# Patient Record
Sex: Female | Born: 1935 | Race: White | Hispanic: No | Marital: Single
Health system: Midwestern US, Community
[De-identification: ages and names within clinical notes are randomized; demographics above are authoritative.]

## PROBLEM LIST (undated history)

## (undated) DIAGNOSIS — E119 Type 2 diabetes mellitus without complications: Secondary | ICD-10-CM

## (undated) DIAGNOSIS — J45909 Unspecified asthma, uncomplicated: Secondary | ICD-10-CM

## (undated) DIAGNOSIS — I10 Essential (primary) hypertension: Secondary | ICD-10-CM

## (undated) DIAGNOSIS — E7800 Pure hypercholesterolemia, unspecified: Secondary | ICD-10-CM

## (undated) DIAGNOSIS — E039 Hypothyroidism, unspecified: Secondary | ICD-10-CM

## (undated) DIAGNOSIS — J449 Chronic obstructive pulmonary disease, unspecified: Secondary | ICD-10-CM

## (undated) HISTORY — PX: ABDOMINAL HYSTERECTOMY: SHX81

## (undated) HISTORY — PX: CHOLECYSTECTOMY: SHX55

---

## 2014-01-17 ENCOUNTER — Inpatient Hospital Stay
Admit: 2014-01-17 | Discharge: 2014-01-20 | Disposition: A | Discharge status: Home / Self Care | Payer: PRIVATE HEALTH INSURANCE | Attending: Family Medicine | Admitting: Family Medicine

## 2014-01-17 DIAGNOSIS — J9600 Acute respiratory failure, unspecified whether with hypoxia or hypercapnia: Secondary | ICD-10-CM

## 2014-01-17 LAB — CBC WITH AUTOMATED DIFF
ABS. BASOPHILS: 0 10*3/uL (ref 0.0–0.06)
ABS. EOSINOPHILS: 0.4 10*3/uL (ref 0.0–0.4)
ABS. LYMPHOCYTES: 1.3 10*3/uL (ref 0.9–3.6)
ABS. MONOCYTES: 0.5 10*3/uL (ref 0.05–1.2)
ABS. NEUTROPHILS: 8.8 10*3/uL — ABNORMAL HIGH (ref 1.8–8.0)
BASOPHILS: 0 % (ref 0–2)
EOSINOPHILS: 3 % (ref 0–5)
HCT: 46.1 % — ABNORMAL HIGH (ref 35.0–45.0)
HGB: 15.8 g/dL (ref 12.0–16.0)
LYMPHOCYTES: 12 % — ABNORMAL LOW (ref 21–52)
MCH: 31.2 PG (ref 24.0–34.0)
MCHC: 34.3 g/dL (ref 31.0–37.0)
MCV: 90.9 FL (ref 74.0–97.0)
MONOCYTES: 5 % (ref 3–10)
MPV: 10.7 FL (ref 9.2–11.8)
NEUTROPHILS: 80 % — ABNORMAL HIGH (ref 40–73)
PLATELET: 182 10*3/uL (ref 135–420)
RBC: 5.07 M/uL (ref 4.20–5.30)
RDW: 12.7 % (ref 11.6–14.5)
WBC: 11 10*3/uL (ref 4.6–13.2)

## 2014-01-17 LAB — METABOLIC PANEL, COMPREHENSIVE
A-G Ratio: 0.9 (ref 0.8–1.7)
ALT (SGPT): 64 U/L — ABNORMAL HIGH (ref 13–56)
AST (SGOT): 46 U/L — ABNORMAL HIGH (ref 15–37)
Albumin: 3.7 g/dL (ref 3.4–5.0)
Alk. phosphatase: 135 U/L — ABNORMAL HIGH (ref 45–117)
Anion gap: 13 mmol/L (ref 3.0–18)
BUN/Creatinine ratio: 15 (ref 12–20)
BUN: 12 MG/DL (ref 7.0–18)
Bilirubin, total: 0.4 MG/DL (ref 0.2–1.0)
CO2: 30 mmol/L (ref 21–32)
Calcium: 9.5 MG/DL (ref 8.5–10.1)
Chloride: 95 mmol/L — ABNORMAL LOW (ref 100–108)
Creatinine: 0.8 MG/DL (ref 0.6–1.3)
GFR est AA: >60 mL/min/{1.73_m2} (ref >60)
GFR est non-AA: >60 mL/min/{1.73_m2} (ref >60)
Globulin: 4 g/dL (ref 2.0–4.0)
Glucose: 294 mg/dL — ABNORMAL HIGH (ref 74–99)
Potassium: 4 mmol/L (ref 3.5–5.5)
Protein, total: 7.7 g/dL (ref 6.4–8.2)
Sodium: 138 mmol/L (ref 136–145)

## 2014-01-17 LAB — BLOOD GAS, ARTERIAL POC
Base excess (POC): 4 mmol/L
Bleed In: 0 L/min
FIO2 (POC): 0.4 %
HCO3 (POC): 30.4 MMOL/L — ABNORMAL HIGH (ref 22–26)
PEEP/CPAP (POC): 5 cmH2O
PIP (POC): 13
Patient temp.: 98.6
Pressure support: 7 cmH2O
Total resp. rate: 28
pCO2 (POC): 60.2 MMHG — ABNORMAL HIGH (ref 35.0–45.0)
pH (POC): 7.312 — ABNORMAL LOW (ref 7.35–7.45)
pO2 (POC): 184 MMHG — ABNORMAL HIGH (ref 80–100)
sO2 (POC): 99 % — ABNORMAL HIGH (ref 92–97)

## 2014-01-17 LAB — D DIMER: D DIMER: 0.89 ug/ml(FEU) — ABNORMAL HIGH (ref <0.46)

## 2014-01-17 LAB — D-DIMER, QUANTITATIVE: D-Dimer, Quant: 0.89 ug/ml(FEU) — ABNORMAL HIGH (ref <0.46)

## 2014-01-17 MED ORDER — METHYLPREDNISOLONE (PF) 125 MG/2 ML IJ SOLR
125 mg/2 mL | INTRAMUSCULAR | Status: AC
Start: 2014-01-17 — End: 2014-01-17
  Administered 2014-01-17: 23:00:00 via INTRAVENOUS

## 2014-01-17 MED ORDER — IPRATROPIUM-ALBUTEROL 2.5 MG-0.5 MG/3 ML NEB SOLUTION
2.5 mg-0.5 mg/3 ml | RESPIRATORY_TRACT | Status: AC
Start: 2014-01-17 — End: 2014-01-17
  Administered 2014-01-17: 23:00:00 via RESPIRATORY_TRACT

## 2014-01-17 MED ORDER — IPRATROPIUM-ALBUTEROL 2.5 MG-0.5 MG/3 ML NEB SOLUTION
2.5 mg-0.5 mg/3 ml | RESPIRATORY_TRACT | Status: AC
Start: 2014-01-17 — End: 2014-01-17

## 2014-01-17 MED ADMIN — sodium chloride 0.9 % bolus infusion 1,000 mL: INTRAVENOUS | NDC 00409798303

## 2014-01-17 MED ADMIN — albuterol-ipratropium (DUO-NEB) 2.5 mg-0.5 mg/3 ml nebulizer solution: RESPIRATORY_TRACT | @ 21:00:00 | NDC 00487020101

## 2014-01-17 MED FILL — SOLU-MEDROL (PF) 125 MG/2 ML SOLUTION FOR INJECTION: 125 mg/2 mL | INTRAMUSCULAR | Qty: 2

## 2014-01-17 MED FILL — IPRATROPIUM-ALBUTEROL 2.5 MG-0.5 MG/3 ML NEB SOLUTION: 2.5 mg-0.5 mg/3 ml | RESPIRATORY_TRACT | Qty: 3

## 2014-01-17 MED FILL — SODIUM CHLORIDE 0.9 % IV: INTRAVENOUS | Qty: 1000

## 2014-01-17 NOTE — Progress Notes (Signed)
2250-Patient received from ED on Bipap.  Patient is alert and oreinted.  RR labored and tachynapic.  Patient reporting an improving in respiratory status since Bipap was initiated.  Patient denies pain.  Patient noted to be diaphoretic, skin noted to have a flat red rash which patient states is her norm.  We are telemetry sheet reviewed and signed.     [facility]  Patient returned to bed and positioned for comfort.  Call light is within reach.  Will monitor.    0030-Patient unable to give detailed PTA medication list.  Some medications updated, significant other to bring medications from home in the morning to complete the list.    0700-Respiratory paged to remove bipap to do room air trial.

## 2014-01-17 NOTE — ED Notes (Signed)
Spoke with Dr. Denyse DagoAmer regarding BP. Orders received. Selena BattenKim, RN on telemetry called and notified of BP 218/105 and prescribed Lopressor to be given in ED.

## 2014-01-17 NOTE — Other (Signed)
TRANSFER - IN REPORT:    Verbal report received from [place] Klein, RN(name) on Sarah Livingston  being received from ED(unit) for routine progression of care      Report consisted of patient???s Situation, Background, Assessment and   Recommendations(SBAR).     Information from the following report(s) SBAR, Kardex, Intake/Output, MAR and Recent Results was reviewed with the receiving nurse.    Opportunity for questions and clarification was provided.      Assessment to be completed upon patient???s arrival to unit and care assumed.

## 2014-01-17 NOTE — ED Notes (Signed)
Pt states she feels " a little better with C pap " O2 sats 100%.

## 2014-01-17 NOTE — Other (Signed)
TRANSFER - OUT REPORT:    Bedside report given to RN (name) on Sarah CopperCarolyn Livingston  being transferred to Telemetry (unit) for routine progression of care       Report consisted of patient???s Situation, Background, Assessment and   Recommendations(SBAR).     Information from the following report(s) SBAR, ED Summary, Intake/Output and MAR was reviewed with the receiving nurse.    Opportunity for questions and clarification was provided.

## 2014-01-17 NOTE — ED Notes (Signed)
Bedside report received from Littie DeedsKimberly Twine, RN. Patient currently on BIPAP. Aaox4. Sinus tachycardia of 120 on cardiac monitor. 1L NS bolus started as ordered. Family at bedside.

## 2014-01-17 NOTE — ED Provider Notes (Signed)
HPI Comments: 6:33 PM   80 y.o. female presents to the ED C/O SOB. Pt has a hx of COPD. Pt was hospitalized recently in [place] for 5 days for COPD and possible pneumonia. Treated and released on current medications (noted in [facility]). Pt has an inhaler and uses Albuterol via nebulizer. Pt is not on any long acting medications for COPD and is unsure what these are. Pt gets hot and sweaty when she has her COPD flare ups and same occurred today. She denies fever, chills, nasal congestion, ear ache or difficulty swallowing.  There is no productive cough when questioned. No post tussive emesis, chest pain or chest tightness. Her regular medication is not working and her husband and she are visiting area. He became very nervous and thought she should be evaluated in ED. Patient notes COPD exacerbation similar in past. No known sick contacts but has been traveling so possible. No other complaints and has been in her usual state of health.     The history is provided by the patient and the spouse. No language interpreter was used.        Past Medical History   Diagnosis Date   ??? Chronic obstructive pulmonary disease (HCC)    ??? Morbid obesity (HCC)    ??? Bronchitis    ??? Hypertension    ??? Diabetes (HCC)    ??? High cholesterol    ??? Hypothyroid         Past Surgical History   Procedure Laterality Date   ??? Hx tah and bso       Uterine Fibroids   ??? Hx cholecystectomy     ??? Hx hysterectomy           Family History   Problem Relation Age of Onset   ??? Asthma Mother         History     Social History   ??? Marital Status: SINGLE     Spouse Name: N/A     Number of Children: N/A   ??? Years of Education: N/A     Occupational History   ??? Not on file.     Social History Main Topics   ??? Smoking status: Former Smoker -- 2.00 packs/day for 30 years     Types: Cigarettes   ??? Smokeless tobacco: Never Used   ??? Alcohol Use: No   ??? Drug Use: No   ??? Sexual Activity:     Partners: Male     Other Topics Concern   ??? Not on file     Social History Narrative    ??? No narrative on file          ALLERGIES: Latex and Pcn        Review of Systems   Constitutional: Positive for activity change and fatigue. Negative for fever, diaphoresis and appetite change.   HENT: Negative for congestion, ear pain, postnasal drip, rhinorrhea, sinus pressure, sneezing, sore throat and trouble swallowing.    Eyes: Negative for discharge and redness.   Respiratory: Positive for cough, shortness of breath and wheezing. Negative for chest tightness.    Cardiovascular: Negative for chest pain and palpitations.   Gastrointestinal: Negative for nausea, vomiting, abdominal pain and diarrhea.   Endocrine: Negative for polydipsia.   Genitourinary: Negative for decreased urine volume.   Musculoskeletal: Positive for myalgias and back pain (Chronic.).   Skin: Negative for color change and pallor.   Allergic/Immunologic: Negative for immunocompromised state.   Neurological: Negative for dizziness,  light-headedness and headaches.   Hematological: Does not bruise/bleed easily.   Psychiatric/Behavioral: The patient is nervous/anxious.        Filed Vitals:    01/17/14 1640 01/17/14 1820 01/17/14 1827   BP: 204/116     Pulse: 131 116 117   Temp: 98.1 ??F (36.7 ??C)     Resp: 25 23 23    Height: 5\' 4"  (1.626 m)     Weight: 92.987 kg (205 lb)     SpO2: 86% 97% 98%            Physical Exam   Nursing note and vitals reviewed.       -------------------------PHYSICAL EXAM-------------------------    CONSTITUTIONAL: Well developed, well nourished pleasant patient who appears stated age. Awake and alert. Non-toxic in appearance, not diaphoretic. Afebrile.     HEAD: Normocephalic, Atraumatic.    EYES: Pupils are equal, round and reactive. Extra-ocular movements intact. Sclera anicteric. Conjunctiva not injected.     ENT: Mucous membranes are moist and intact. Oropharynx is clear, no exudates. No oral lesions or thrush. The left TM is unremarkable. The right TM is unremarkable. Nasal mucosa pink with no discharge.     NECK:  Normal ROM. Neck is supple. No cervical paraspinal or midline tenderness. No jugular venous distention.     CARDIOVASCULAR: Tachycardic rate and regular rhythm. No murmurs, rubs or gallops. Distal pulses are 2+ and equal.    PULMONARY/CHEST:  Diffuse inspiratory and expiratory wheezes. No rales or rhonchi. Patient is speaking in full sentences without accessory muscle usage. Chest wall non-tender to palpation.     ABDOMINAL: Exogenous obesity. Soft and non-distended. No tenderness. No rebound, guarding or rigidity. Active bowel sounds present.    BACK: No thoracolumbar midline or paraspinal tenderness. Full range of motion. No CVA tenderness.     MUSCULOSKELETAL: Full range of motion in all extremities. No peripheral edema or muscular tenderness.     SKIN: Skin is warm and dry. No diaphoresis, lesions or rashes.     NEUROLOGICAL: Alert, awake and appropriately oriented. Normal speech.       -------------------------DIAGNOSTICS:-------------------------    LABORATORY    Labs Reviewed   CBC WITH AUTOMATED DIFF - Abnormal; Notable for the following:     HCT 46.1 (*)     NEUTROPHILS 80 (*)     LYMPHOCYTES 12 (*)     ABS. NEUTROPHILS 8.8 (*)     All other components within normal limits   D DIMER - Abnormal; Notable for the following:     D DIMER 0.89 (*)     All other components within normal limits   METABOLIC PANEL, COMPREHENSIVE       EKG  Time: 16:52  Indication: SOB  Sinus tachycardia at 120 bpm, low voltage QRS, normal PR and QRS, no acute ST changes, normal axis  No old EKG available for comparison   The EKG is signed and interpreted by me.         RADIOLOGY:  XR: Chest  Indication: Bibasilar haziness, but no acute cardiopulmonary findings.   Interpreted by Blenda Bridegroomobin Kitiara Hintze, MD  Pending final radiology reading.         ED Objective Order Summary:    Orders Placed This Encounter   ??? XR CHEST PA LAT     Standing Status: Standing      Number of Occurrences: 1      Standing Expiration Date:      Order Specific  Question:  Transport  Answer:  Doctor, general practice [5]     Order Specific Question:  Reason for Exam     Answer:  SOB, hx of COPD   ??? CBC WITH AUTOMATED DIFF     Standing Status: Standing      Number of Occurrences: 1      Standing Expiration Date:    ??? METABOLIC PANEL, COMPREHENSIVE     Standing Status: Standing      Number of Occurrences: 1      Standing Expiration Date:    ??? D DIMER     Standing Status: Standing      Number of Occurrences: 1      Standing Expiration Date:    ??? CARDIAC MONITORING     Standing Status: Standing      Number of Occurrences: 1      Standing Expiration Date:    ??? PULSE OXIMETRY CONTINUOUS     Standing Status: Standing      Number of Occurrences: 1      Standing Expiration Date:    ??? EKG, 12 LEAD, INITIAL     Standing Status: Standing      Number of Occurrences: 1      Standing Expiration Date:      Order Specific Question:  Reason for Exam:     Answer:  shortness of brath   ??? INSERT PERIPHERAL IV ONE TIME STAT     Standing Status: Standing      Number of Occurrences: 1      Standing Expiration Date:    ??? albuterol-ipratropium (DUO-NEB) 2.5 MG-0.5 MG/3 ML     Sig:      Order Specific Question:  MODE OF DELIVERY     Answer:  Nebulizer   ??? albuterol-ipratropium (DUO-NEB) 2.5 mg-0.5 mg/3 ml nebulizer solution     Sig:      twine, kimberly: cabinet override         -----------------------ED COURSE AND MEDICAL DECISION MAKING----------------------    Documentation Review:  I have examined the available past medical records from previous ED visits, hospital admissions or clinic visits. The information obtained has contributed to PMSHx and the context for this visit.   The nursing notes for this patient's current visit have been reviewed and any pertinent information has been incorporated into this note, particularly the vital signs, PMSFSHx and initial presenting complaint.  In addition, I have reviewed this patients vital signs and find no vitals in need of emergent intervention at this time except  elevated BO and will continue to monitor.       Medications   albuterol-ipratropium (DUO-NEB) 2.5 MG-0.5 MG/3 ML (3 mL Nebulization Given 01/17/14 1645)   albuterol-ipratropium (DUO-NEB) 2.5 MG-0.5 MG/3 ML (3 mL Nebulization Given 01/17/14 1906)   methylPREDNISolone (PF) (SOLU-MEDROL) injection 125 mg (125 mg IntraVENous Given 01/17/14 1904)   sodium chloride 0.9 % bolus infusion 1,000 mL (0 mL IntraVENous IV Completed 01/17/14 2154)   aztreonam (AZACTAM) 2 g in 0.9% sodium chloride (MBP/ADV) 100 mL MBP (0 g IntraVENous IV Completed 01/18/14 0015)   levofloxacin (LEVAQUIN) 750 mg in D5W IVPB (0 mg IntraVENous IV Completed 01/17/14 2330)   metoprolol (LOPRESSOR) injection 5 mg (5 mg IntraVENous Given 01/17/14 2153)           Differential Diagnoses and Medical Decision Making: COPD exacerbation, Pneumonia and consider HAP with tx to be initiated in ED, PE, doubt ACS or CHF. Patient with hx of COPD and from medications and management it is not clear  that she is receiving medications for COPD maintenance versus symptomatic treatment. From out of town and unable to access records. ED monitoring, EKG, labs and symptomatic treatment. Additional imaging and interventions based on results of studies and patient response to ED treatment. Will plan on admit with IV antibiotics and management for COPD pending final studies and presumptive DX of HAP and COPD exacerbation.         Physician Notes:   I've reviewed the plan for treatment at bedside and received agreement with the plan as discussed. I've answered questions at this time and the plan is stated as understood.     Time: 7:57 PM  Patient being treated with duo neb and labs and imaging ordered. She is feeling better after nebulizer treatment. We discussed all available studies and I have recommended admission and husband wants her admitted. Patient agrees with this plan of care. Reassessment with improved air movement to bases and continues with inspiratory and expiratory  wheezing. BiPAP initiated due to no significant improvement with nebulizer treatments.     Time: 8:27 PM  Improving significantly on BiPAP. Patient acknowledges  feeling better. Air movement to bases and contnues with inspiratory and expiratory wheezing. Pending call back from hospitalist for admission.       Time: 8:55 PM  Closely monitored by ED MD pending admission.  Patient will benefit from admission and adjustment of medications for a patient with COPD  needing long acting inhalers and rescue breathing treatment. Steroids and antibiotics initiated in ED pending further evaluation by admit team and decision regarding HAP versus COPD exacerbation.       CONSULT NOTE:   8:54 PM  Blenda Bridegroom, MD  spoke with Dr. Denyse Dago   Specialty: Internal Medicine   Discussed pt's hx, disposition, and available diagnostic and imaging results. Reviewed care plans. Consulting physician agrees with plans as outlined.  Agrees to admit pt to telemetry.         MDM  Number of Diagnoses or Management Options  Acute exacerbation of chronic obstructive pulmonary disease (COPD) (HCC): established and worsening  Acute respiratory failure (HCC): new and requires workup  HAP (hospital-acquired pneumonia): new and requires workup  Diagnosis management comments: See differential diagnosis and medical decision making entered on ED chart.        Amount and/or Complexity of Data Reviewed  Clinical lab tests: ordered and reviewed  Tests in the radiology section of CPT??: ordered and reviewed (XR: Chest)  Tests in the medicine section of CPT??: reviewed and ordered (EKG)  Obtain history from someone other than the patient: yes (Husband.)  Discuss the patient with other providers: yes (Admit MD. )  Independent visualization of images, tracings, or specimens: yes (XR: Chest  EKG)    Risk of Complications, Morbidity, and/or Mortality  Presenting problems: high  Diagnostic procedures: high  Management options: high    Patient Progress  Patient progress:  improved      Procedures   NONE      ADMISSION NOTE:  8:54 PM  Patient is being admitted to the hospital by Dr. Denyse Dago. The results of their tests and reasons for their admission have been discussed with them and/or available family. They convey agreement and understanding for the need to be admitted and for their admission diagnosis.    Written by Harrold Donath, ED Scribe, as dictated by Blenda Bridegroom, MD .    CONDITIONS ON ADMISSION:  Deep Vein Thrombosis is not present at the time of admission. Thrombosis  is not present at the time of admission. Urinary Tract Infection is not present at the time of admission. Pneumonia is present at the time of admission. MRSA is not present at the time of admission. Wound infection is not present at the time of admission. Pressure Ulcer is not present at the time of admission.     Critical Care Note:  Critical care minutes:  45 minutes.    Given the patients underlying condition, requiring numerous reevaluations of patient's vital signs and response to different emergency department therapies, total bedside time evaluating and/or treating the patient, not including procedures, is noted above.           CLINICAL IMPRESSION    1. HAP (hospital-acquired pneumonia)    2. Acute exacerbation of chronic obstructive pulmonary disease (COPD)    3. Acute respiratory failure          Written by Luz Brazen, ED Scribe, as dictated by Blenda Bridegroom, MD     Note:   Scribe documentation has been amended by Velora Mediate, MD FACEP to reflect corrections in HPI, ROS, Examination, Interpretations and actual ED course and treatment provided. My attestation indicates only those portions of the scribed chart properly documented based upon the work I performed.     Velora Mediate, MD Armando Gang

## 2014-01-17 NOTE — ED Notes (Signed)
resp to bedside to place pt on C pap

## 2014-01-17 NOTE — H&P (Signed)
History & Physical    Patient: Sarah Livingston MRN: [id number]  CSN: [id number]    Date of Birth: 1934-02-10  Age: 80 y.o.  Sex: female      DOA: 01/17/2014  Primary Care Provider:  Phys Other, MD      Assessment/Plan     Active Problems:    HAP (hospital-acquired pneumonia) (01/17/2014)      Acute respiratory failure (01/17/2014)      COPD (chronic obstructive pulmonary disease) (01/17/2014)      HTN (hypertension) (01/17/2014)      DM (diabetes mellitus) (01/17/2014)      Admit to telemetry  Acute respiratory failure - secondary to pneumonia, COPD, continue BiPAP support, pulmonary consult  COPD - Intravenous steroids.  Inhaled short acting beta 2 agonist and anticholinergics.  Possible HAP - start on vancomycin, azactam, levaquin, Sputum cultures and sensitivity.  HTN - uncontrolled, she takes HCTZ and norvasc at home. Will continue  DM - lantus, start on SSI  DVT prophylaxis  GI prophylaxis  45 minutes of critical care time spent in the direct evaluation and treatment of this high risk patient. The reason for providing this level of medical care for this critically ill patient was due a critical illness that impaired one or more vital organ systems such that there was a high probability of imminent or life threatening deterioration in the patients condition. This care involved high complexity decision making to assess, manipulate, and support vital system functions, to treat this degreee vital organ system failure and to prevent further life threatening deterioration of the patient???s condition.                 CC: SOB       HPI:     Sarah Livingston is a 80 y.o. female who has past history of COPD, DM, HTN presents to ER with complains of SOB that started since yesterday. Patient came to this area from [place] yesterday. She was hospitalized in [place] for COPD exacerbation and discharged about a week ago. Patient reports that she was doing fine and drove from [place] yesterday. She has been getting more and  more SOB and presented to ER. She is on advair and has been using albuterol without relief. She denies any chest pain, palpitations, fever/chills, N/V.   In ER ABG showed 7.3/60.2/184/30.4 on BiPAP. BP of 237/122, pulse of 131, respirations of 25, initial oxygen sats at 86%. CXR showed possible LLL pneumonia (official report pending). She is being admitted for further management.     Past Medical History   Diagnosis Date   ??? Chronic obstructive pulmonary disease    ??? Morbid obesity    ??? Bronchitis    ??? Hypertension    ??? Diabetes    ??? High cholesterol    ??? Hypothyroid        Past Surgical History   Procedure Laterality Date   ??? Hx tah and bso       Uterine Fibroids   ??? Hx cholecystectomy     ??? Hx hysterectomy         History reviewed. No pertinent family history.    History     Social History   ??? Marital Status: SINGLE     Spouse Name: N/A     Number of Children: N/A   ??? Years of Education: N/A     Social History Main Topics   ??? Smoking status: Former Smoker -- 2.00 packs/day for 30 years   ???  Smokeless tobacco: Never Used   ??? Alcohol Use: No   ??? Drug Use: No   ??? Sexual Activity:     Partners: Male     Other Topics Concern   ??? Not on file     Social History Narrative   ??? No narrative on file       Prior to Admission medications    Not on File       Allergies   Allergen Reactions   ??? Latex Rash   ??? Pcn [Penicillins] Rash       Review of Systems  Gen: No fever, chills, malaise.   Heent: No headache, rhinorrhea, sinus pain, neck pain/stiffness, sore throat.   Heart: No cp, palps, pnd, or orthopnea.   Resp: No cough and shortness of breath.   GI: No n/v/d/c. No melena or hematochezia.   GU: No dysuria or hematuria.   Derm: No rash, skin lesion or pruritis.   Musc/skeletal: no bone or joint complains.  Vasc: No edema, cyanosis or claudication.   Endo: No heat/cold intolerance, no polyuria,polydipsia or polyphagia.   Neuro: No weakness or paresthesias.   Heme: No epistaxis, hematemesis, melena, hematochezia           Physical Exam:     Physical Exam:  BP 180/103    Pulse 116    Temp(Src) 98.3 ??F (36.8 ??C)    Resp 24    Ht 5\' 4"  (1.626 m)    Wt 92.987 kg (205 lb)    BMI 35.17 kg/m2      SpO2 95%    O2 Device: BIPAP    Temp (24hrs), Avg:98.2 ??F (36.8 ??C), Min:98.1 ??F (36.7 ??C), Max:98.3 ??F (36.8 ??C)             General:  Alert, cooperative, no distress, appears stated age.   Head:  Normocephalic, without obvious abnormality, atraumatic.   Eyes:  Conjunctivae/corneas clear. PERRL, EOMs intact.   Nose: Nares normal. No drainage or sinus tenderness.   Throat: Lips, mucosa, and tongue normal. .   Neck: Supple, symmetrical, trachea midline, no adenopathy, thyroid: no enlargement/tenderness/nodules, no carotid bruit and no JVD.       Lungs:   Clear to auscultation bilaterally.   Chest wall:  No tenderness or deformity.   Heart:  Regular rate and rhythm, S1, S2 normal, no murmur, click, rub or gallop.     Abdomen: Soft, non-tender. Bowel sounds normal. No masses,  No organomegaly.   Extremities: Extremities normal, atraumatic, no cyanosis or edema.   Pulses: 2+ and symmetric all extremities.   Skin: Skin color, texture, turgor normal. No rashes or lesions   Neurologic: CNII-XII intact. No focal motor or sensory deficit.       Labs Reviewed:    CMP:   Lab Results   Component Value Date/Time    NA 138 01/17/2014  5:36 PM    K 4.0 01/17/2014  5:36 PM    CL 95* 01/17/2014  5:36 PM    CO2 30 01/17/2014  5:36 PM    AGAP 13 01/17/2014  5:36 PM    GLU 294* 01/17/2014  5:36 PM    BUN 12 01/17/2014  5:36 PM    CREA 0.80 01/17/2014  5:36 PM    GFRAA >60 01/17/2014  5:36 PM    GFRNA >60 01/17/2014  5:36 PM    CA 9.5 01/17/2014  5:36 PM    TP 7.7 01/17/2014  5:36 PM    ALB 3.7 01/17/2014  5:36 PM    GLOB 4.0 01/17/2014  5:36 PM    AGRAT 0.9 01/17/2014  5:36 PM    SGOT 46* 01/17/2014  5:36 PM    ALT 64* 01/17/2014  5:36 PM     CBC:   Lab Results   Component Value Date/Time    WBC 11.0 01/17/2014  5:36 PM    HGB 15.8 01/17/2014  5:36 PM    HCT 46.1* 01/17/2014  5:36 PM     PLT 182 01/17/2014  5:36 PM     All Cardiac Markers in the last 24 hours:   Lab Results   Component Value Date/Time    CPK 37 01/17/2014  7:45 PM    CKMB 1.2 01/17/2014  7:45 PM    CKND1 3.2 01/17/2014  7:45 PM    TROIQ 0.03 01/17/2014  7:45 PM       Procedures/imaging: see electronic medical records for all procedures/Xrays and details which were not copied into this note but were reviewed prior to creation of Plan            CC: Phys Other, MD

## 2014-01-18 LAB — CBC W/O DIFF
HCT: 43.7 % (ref 35.0–45.0)
HGB: 14.8 g/dL (ref 12.0–16.0)
MCH: 31 PG (ref 24.0–34.0)
MCHC: 33.9 g/dL (ref 31.0–37.0)
MCV: 91.4 FL (ref 74.0–97.0)
MPV: 10.8 FL (ref 9.2–11.8)
PLATELET: 191 10*3/uL (ref 135–420)
RBC: 4.78 M/uL (ref 4.20–5.30)
RDW: 12.7 % (ref 11.6–14.5)
WBC: 12.8 10*3/uL (ref 4.6–13.2)

## 2014-01-18 LAB — CARDIAC PANEL,(CK, CKMB & TROPONIN)
CK - MB: 1.2 ng/ml (ref 0.5–3.6)
CK-MB Index: 3.2 % (ref 0.0–4.0)
CK: 37 U/L (ref 26–192)
Troponin-I, QT: 0.03 NG/ML (ref 0.00–0.06)

## 2014-01-18 LAB — METABOLIC PANEL, BASIC
Anion gap: 11 mmol/L (ref 3.0–18)
BUN/Creatinine ratio: 14 (ref 12–20)
BUN: 11 MG/DL (ref 7.0–18)
CO2: 27 mmol/L (ref 21–32)
Calcium: 9.1 MG/DL (ref 8.5–10.1)
Chloride: 99 mmol/L — ABNORMAL LOW (ref 100–108)
Creatinine: 0.8 MG/DL (ref 0.6–1.3)
GFR est AA: >60 mL/min/{1.73_m2} (ref >60)
GFR est non-AA: >60 mL/min/{1.73_m2} (ref >60)
Glucose: 334 mg/dL — ABNORMAL HIGH (ref 74–99)
Potassium: 4 mmol/L (ref 3.5–5.5)
Sodium: 137 mmol/L (ref 136–145)

## 2014-01-18 LAB — MAGNESIUM: Magnesium: 1.7 mg/dL — ABNORMAL LOW (ref 1.8–2.4)

## 2014-01-18 LAB — GLUCOSE, POC
Glucose (POC): 369 mg/dL — ABNORMAL HIGH (ref 70–110)
Glucose (POC): 348 mg/dL — ABNORMAL HIGH (ref 70–110)
Glucose (POC): 306 mg/dL — ABNORMAL HIGH (ref 70–110)
Glucose (POC): 340 mg/dL — ABNORMAL HIGH (ref 70–110)

## 2014-01-18 LAB — PHOSPHORUS: Phosphorus: 2.4 MG/DL — ABNORMAL LOW (ref 2.5–4.9)

## 2014-01-18 LAB — LACTIC ACID: Lactic acid: 1.6 MMOL/L (ref 0.4–2.0)

## 2014-01-18 MED ORDER — METOPROLOL TARTRATE 5 MG/5 ML IV SOLN
5 mg/ mL | Freq: Once | INTRAVENOUS | Status: AC
Start: 2014-01-18 — End: 2014-01-17
  Administered 2014-01-18: 02:00:00 via INTRAVENOUS

## 2014-01-18 MED ADMIN — vancomycin (VANCOCIN) 750 mg in 0.9% sodium chloride (MBP/ADV) 250 mL ADV: INTRAVENOUS | @ 17:00:00 | NDC 00409710104

## 2014-01-18 MED ADMIN — albuterol-ipratropium (DUO-NEB) 2.5 MG-0.5 MG/3 ML: RESPIRATORY_TRACT | @ 23:00:00 | NDC 00487020101

## 2014-01-18 MED ADMIN — hydrochlorothiazide (HYDRODIURIL) tablet 25 mg: ORAL | @ 04:00:00 | NDC 68084008611

## 2014-01-18 MED ADMIN — methylPREDNISolone (PF) (SOLU-MEDROL) injection 60 mg: INTRAVENOUS | @ 10:00:00 | NDC 00009004725

## 2014-01-18 MED ADMIN — vancomycin (VANCOCIN) 1,000 mg in 0.9% sodium chloride (MBP/ADV) 250 mL adv: INTRAVENOUS | @ 01:00:00 | NDC 72611076510

## 2014-01-18 MED ADMIN — sodium chloride (NS) flush 5-10 mL: INTRAVENOUS | @ 10:00:00 | NDC 87701099893

## 2014-01-18 MED ADMIN — insulin lispro (HUMALOG) injection: SUBCUTANEOUS | @ 21:00:00 | NDC 00002751017

## 2014-01-18 MED ADMIN — aztreonam (AZACTAM) 1 g in 0.9% sodium chloride (MBP/ADV) 100 mL MBP: INTRAVENOUS | @ 17:00:00 | NDC 63323040141

## 2014-01-18 MED ADMIN — insulin glargine (LANTUS) injection 10 Units: SUBCUTANEOUS | @ 04:00:00 | NDC 00088222033

## 2014-01-18 MED ADMIN — albuterol-ipratropium (DUO-NEB) 2.5 MG-0.5 MG/3 ML: RESPIRATORY_TRACT | @ 08:00:00 | NDC 00487020101

## 2014-01-18 MED ADMIN — aztreonam (AZACTAM) 1 g in 0.9% sodium chloride (MBP/ADV) 100 mL MBP: INTRAVENOUS | @ 10:00:00 | NDC 63323040120

## 2014-01-18 MED ADMIN — phenytoin ER (DILANTIN ER) ER capsule 200 mg: ORAL | @ 14:00:00 | NDC 00071036940

## 2014-01-18 MED ADMIN — methylPREDNISolone (PF) (SOLU-MEDROL) injection 40 mg: INTRAVENOUS | @ 22:00:00 | NDC 00009004725

## 2014-01-18 MED ADMIN — insulin lispro (HUMALOG) injection: SUBCUTANEOUS | @ 16:00:00 | NDC 00002751017

## 2014-01-18 MED ADMIN — albuterol-ipratropium (DUO-NEB) 2.5 MG-0.5 MG/3 ML: RESPIRATORY_TRACT | @ 04:00:00 | NDC 00487020101

## 2014-01-18 MED ADMIN — insulin lispro (HUMALOG) injection 8 Units: SUBCUTANEOUS | @ 05:00:00 | NDC 00002751017

## 2014-01-18 MED ADMIN — sodium chloride (NS) flush 5-10 mL: INTRAVENOUS | @ 04:00:00 | NDC 87701099893

## 2014-01-18 MED ADMIN — albuterol-ipratropium (DUO-NEB) 2.5 MG-0.5 MG/3 ML: RESPIRATORY_TRACT | @ 11:00:00 | NDC 00487020101

## 2014-01-18 MED ADMIN — albuterol-ipratropium (DUO-NEB) 2.5 MG-0.5 MG/3 ML: RESPIRATORY_TRACT | @ 15:00:00 | NDC 00487020101

## 2014-01-18 MED ADMIN — albuterol-ipratropium (DUO-NEB) 2.5 MG-0.5 MG/3 ML: RESPIRATORY_TRACT | @ 20:00:00 | NDC 00487020101

## 2014-01-18 MED ADMIN — insulin lispro (HUMALOG) injection: SUBCUTANEOUS | @ 12:00:00 | NDC 00002751017

## 2014-01-18 MED ADMIN — aztreonam (AZACTAM) 1 g in 0.9% sodium chloride (MBP/ADV) 100 mL MBP: INTRAVENOUS | @ 16:00:00 | NDC 63323040124

## 2014-01-18 MED ADMIN — aztreonam (AZACTAM) 2 g in 0.9% sodium chloride (MBP/ADV) 100 mL MBP: INTRAVENOUS | @ 04:00:00 | NDC 63323040220

## 2014-01-18 MED ADMIN — pantoprazole (PROTONIX) tablet 40 mg: ORAL | @ 14:00:00 | NDC 68084081311

## 2014-01-18 MED ADMIN — enoxaparin (LOVENOX) injection 40 mg: SUBCUTANEOUS | @ 04:00:00 | NDC 00075801401

## 2014-01-18 MED ADMIN — phenytoin ER (DILANTIN ER) ER capsule 300 mg: ORAL | @ 06:00:00 | NDC 00071036940

## 2014-01-18 MED ADMIN — levofloxacin (LEVAQUIN) 750 mg in D5W IVPB: INTRAVENOUS | @ 01:00:00 | NDC 25021013283

## 2014-01-18 MED ADMIN — amLODIPine (NORVASC) tablet 10 mg: ORAL | @ 04:00:00 | NDC 68084025911

## 2014-01-18 MED ADMIN — sodium chloride (NS) flush 5-10 mL: INTRAVENOUS | @ 20:00:00 | NDC 87701099893

## 2014-01-18 MED ADMIN — methylPREDNISolone (PF) (SOLU-MEDROL) injection 60 mg: INTRAVENOUS | @ 04:00:00 | NDC 00009004725

## 2014-01-18 MED ADMIN — methylPREDNISolone (PF) (SOLU-MEDROL) injection 60 mg: INTRAVENOUS | @ 16:00:00 | NDC 00009004725

## 2014-01-18 MED ADMIN — vancomycin (VANCOCIN) 750 mg in 0.9% sodium chloride (MBP/ADV) 250 mL ADV: INTRAVENOUS | @ 04:00:00 | NDC 00409710104

## 2014-01-18 MED FILL — VANCOMYCIN 750 MG IV SOLUTION: 750 mg | INTRAVENOUS | Qty: 750

## 2014-01-18 MED FILL — IPRATROPIUM-ALBUTEROL 2.5 MG-0.5 MG/3 ML NEB SOLUTION: 2.5 mg-0.5 mg/3 ml | RESPIRATORY_TRACT | Qty: 3

## 2014-01-18 MED FILL — PANTOPRAZOLE 40 MG TAB, DELAYED RELEASE: 40 mg | ORAL | Qty: 1

## 2014-01-18 MED FILL — SOLU-MEDROL (PF) 125 MG/2 ML SOLUTION FOR INJECTION: 125 mg/2 mL | INTRAMUSCULAR | Qty: 2

## 2014-01-18 MED FILL — BD POSIFLUSH NORMAL SALINE 0.9 % INJECTION SYRINGE: INTRAMUSCULAR | Qty: 10

## 2014-01-18 MED FILL — LOVENOX 40 MG/0.4 ML SUBCUTANEOUS SYRINGE: 40 mg/0.4 mL | SUBCUTANEOUS | Qty: 0.4

## 2014-01-18 MED FILL — HUMALOG U-100 INSULIN 100 UNIT/ML SUBCUTANEOUS SOLUTION: 100 unit/mL | SUBCUTANEOUS | Qty: 3

## 2014-01-18 MED FILL — AZTREONAM 1 GRAM SOLUTION FOR INJECTION: 1 gram | INTRAMUSCULAR | Qty: 1

## 2014-01-18 MED FILL — VANCOMYCIN 1,000 MG IV SOLR: 1000 mg | INTRAVENOUS | Qty: 1000

## 2014-01-18 MED FILL — DILANTIN EXTENDED 100 MG CAPSULE: 100 mg | ORAL | Qty: 2

## 2014-01-18 MED FILL — AMLODIPINE 5 MG TAB: 5 mg | ORAL | Qty: 2

## 2014-01-18 MED FILL — INSULIN GLARGINE 100 UNIT/ML INJECTION: 100 unit/mL | SUBCUTANEOUS | Qty: 1

## 2014-01-18 MED FILL — METOPROLOL TARTRATE 5 MG/5 ML IV SOLN: 5 mg/ mL | INTRAVENOUS | Qty: 5

## 2014-01-18 MED FILL — PHARMACY INFORMATION NOTE: Qty: 1

## 2014-01-18 MED FILL — LEVOFLOXACIN IN D5W 750 MG/150 ML IV PIGGY BACK: 750 mg/150 mL | INTRAVENOUS | Qty: 150

## 2014-01-18 MED FILL — HYDROCHLOROTHIAZIDE 25 MG TAB: 25 mg | ORAL | Qty: 1

## 2014-01-18 MED FILL — AZTREONAM 2 GRAM SOLUTION FOR INJECTION: 2 gram | INTRAMUSCULAR | Qty: 2

## 2014-01-18 MED FILL — DILANTIN EXTENDED 100 MG CAPSULE: 100 mg | ORAL | Qty: 3

## 2014-01-18 NOTE — Progress Notes (Signed)
PATIENT WAS REMOVED FROM BIPAP AND PLACED ON 2L NC.  SPO2 98-100% ON SAME.  PATIENT IS AWAKE AND ALERT AND IN NO RESPIRATORY DISTRESS.  SCHEDULED NEB TX WAS GIVEN.  BS SCATTERED I/E WHEEZES.  PATIENT STATES THAT SHE TAKES NEB TX'S AT HOME.

## 2014-01-18 NOTE — Consults (Signed)
[facility]   Pulmonary and Sleep Medicine     Initial Patient Consult    Patient: Sarah Livingston               Sex: female          DOA: 01/17/2014       Date of Birth:  [birth year]      Age:  80 y.o.        LOS:  LOS: 1 day        Reason for Consult: Acute hypercapnic respiratory failure, COPD exacerbation    IMPRESSION:   Acute hypercapnic respiratory failure, improved clinically on BiPAP and now on RA, no evidence of PNA  COPD (chronic obstructive pulmonary disease) exacerbation  DM (diabetes mellitus)- may get affected by steroids      RECOMMENDATIONS:   ?? Compensated respiratory status, monitor for goal SPO2 >90%  ?? Continue bronchodilators  ?? Taper steroid 40 mg Q6  ?? Antibiotics per primary, await sputum culture  ?? Glycemic control per primary  ?? May restart Advair MDI, Albuterol MDI and Albuterol nebs on DC   ?? DVT and Gi prophylaxis  ?? Further recommendations will be based on the patient's response to recommended treatment and results of the investigation ordered.     HPI:   Mr. Judine Arciniega is a 80 y.o. female who was seen at the request of Dr. Wyvonnia Lora for Acute hypercapnic respiratory failure, COPD exacerbation. The history was provided by the patient. She reports living in [place] and traveling here to family. She reports many years of COPD and recent exacerbation. She follows with local pulmonologist and takes Advair MDI, Albuterol MDI and nebs. She reports being cleared by primary for traveling. She reports stopping by at a local hotel/motel on her way to this area. During stay there she noticed the heating system with smell and soon she started to feels soreness of throat, chest congestion. She noticed wheezing and progressive shortness of breath when she presented to er and noted to have hypercapnia. She was started on BiPAP and admitted. She denies any fever, chills, adenopathy, rash, chest pain, hemoptysis, phlegm, calf pain, leg swelling. She is at present on RA and feels  significantly better close to her baseline.    Past Medical History  Past Medical History   Diagnosis Date   ??? Chronic obstructive pulmonary disease    ??? Morbid obesity    ??? Bronchitis    ??? Hypertension    ??? Diabetes    ??? High cholesterol    ??? Hypothyroid        Past Surgical History  Past Surgical History   Procedure Laterality Date   ??? Hx tah and bso       Uterine Fibroids   ??? Hx cholecystectomy     ??? Hx hysterectomy         Family History  Family History   Problem Relation Age of Onset   ??? Asthma Mother        Social History  History     Social History   ??? Marital Status: SINGLE     Spouse Name: N/A     Number of Children: N/A   ??? Years of Education: N/A     Social History Main Topics   ??? Smoking status: Former Smoker -- 2.00 packs/day for 30 years     Types: Cigarettes   ??? Smokeless tobacco: Never Used   ??? Alcohol Use: No   ??? Drug Use:  No   ??? Sexual Activity:     Partners: Male     Other Topics Concern   ??? Not on file     Social History Narrative       Medications  Prior to Admission medications    Medication Sig Start Date End Date Taking? Authorizing Provider   phenytoin ER (DILANTIN EXTENDED) 100 mg ER capsule Take 200 mg by mouth daily. In the morning   Yes Historical Provider   phenytoin ER (DILANTIN EXTENDED) 100 mg ER capsule Take 300 mg by mouth daily. At hs   Yes Historical Provider   insulin glargine (LANTUS) 100 unit/mL injection 10 Units by SubCUTAneous route nightly.   Yes Historical Provider   amLODIPine (NORVASC) 10 mg tablet Take 10 mg by mouth daily.   Yes Historical Provider   atorvastatin (LIPITOR) 20 mg tablet Take 20 mg by mouth daily.   Yes Historical Provider   levothyroxine (SYNTHROID) 100 mcg tablet Take 100 mcg by mouth Daily (before breakfast).   Yes Historical Provider       Current Facility-Administered Medications   Medication Dose Route Frequency   ??? Vancomycin - Pharmacy to dose  1 Each Other DAILY PRN   ??? vancomycin (VANCOCIN) 750 mg in 0.9% sodium chloride (MBP/ADV) 250 mL ADV   750 mg IntraVENous Q12H   ??? phenytoin ER (DILANTIN ER) ER capsule 300 mg  300 mg Oral QHS   ??? phenytoin ER (DILANTIN ER) ER capsule 200 mg  200 mg Oral DAILY   ??? sodium chloride (NS) flush 5-10 mL  5-10 mL IntraVENous Q8H   ??? sodium chloride (NS) flush 5-10 mL  5-10 mL IntraVENous PRN   ??? methylPREDNISolone (PF) (SOLU-MEDROL) injection 60 mg  60 mg IntraVENous Q6H   ??? levofloxacin (LEVAQUIN) 750 mg in D5W IVPB  750 mg IntraVENous Q24H   ??? enoxaparin (LOVENOX) injection 40 mg  40 mg SubCUTAneous Q24H   ??? aztreonam (AZACTAM) 1 g in 0.9% sodium chloride (MBP/ADV) 100 mL MBP  1 g IntraVENous Q8H   ??? pantoprazole (PROTONIX) tablet 40 mg  40 mg Oral DAILY   ??? albuterol-ipratropium (DUO-NEB) 2.5 MG-0.5 MG/3 ML  3 mL Nebulization Q4H RT   ??? insulin lispro (HUMALOG) injection   SubCUTAneous AC&HS   ??? glucose chewable tablet 16 g  16 g Oral PRN   ??? glucagon (GLUCAGEN) injection 1 mg  1 mg IntraMUSCular PRN   ??? dextrose (D50W) injection syrg 25 g  50 mL IntraVENous PRN   ??? insulin glargine (LANTUS) injection 10 Units  10 Units SubCUTAneous QHS   ??? hydrochlorothiazide (HYDRODIURIL) tablet 25 mg  25 mg Oral DAILY   ??? amLODIPine (NORVASC) tablet 10 mg  10 mg Oral DAILY       Allergy  Allergies   Allergen Reactions   ??? Latex Rash   ??? Pcn [Penicillins] Rash       Review of Systems  General ROS: negative for - chills, fever, malaise, night sweats or weight loss  Psychological ROS: negative for - anxiety, depression or suicidal ideation  Ophthalmic ROS: negative for - eye pain, loss of vision or photophobia  ENT ROS: negative for - epistaxis, headaches, sinus pain or vocal changes  Allergy and Immunology ROS: negative for - hives  Hematological and Lymphatic ROS: negative for - bleeding problems, blood clots, bruising, jaundice, pallor or swollen lymph nodes  Endocrine ROS: negative for - polydipsia/polyuria, skin changes, temperature intolerance or unexpected weight changes  Cardiovascular ROS: negative  for - chest pain, change in  edema, irregular heartbeat, loss of consciousness, murmur, orthopnea, palpitations or paroxysmal nocturnal dyspnea  Gastrointestinal ROS: no abdominal pain, change in bowel habits, or black or bloody stools  negative for - nausea/vomiting, stool incontinence or swallowing difficulty/pain  Genito-Urinary ROS: no dysuria, trouble voiding, or hematuria  Musculoskeletal ROS: positive for - joint pain and joint stiffness  Neurological ROS: no TIA or stroke symptoms  Dermatological ROS: negative for - pruritus, rash or skin lesion changes     Physical Exam:   Vital Signs:    BP 128/61    Pulse 94    Temp(Src) 97.9 ??F (36.6 ??C)    Resp 22    Ht '5\' 4"'  (1.626 m)    Wt 92.534 kg (204 lb)    BMI 35.00 kg/m2      SpO2 94%     O2 Device: Room Air   Temp (24hrs), Avg:98 ??F (36.7 ??C), Min:97.6 ??F (36.4 ??C), Max:98.3 ??F (36.8 ??C)     Intake/Output:   Last shift:      03/22 0700 - 03/22 1859  In: 600 [P.O.:600]  Out: 1100 [Urine:1100]  Last 3 shifts:      Intake/Output Summary (Last 24 hours) at 01/18/14 1342  Last data filed at 01/18/14 1243   Gross per 24 hour   Intake    600 ml   Output   1100 ml   Net   -500 ml      General: in no respiratory distress and acyanotic and oriented times 3, alert, cooperative, no distress, appears stated age, moderately obese  HEENT: PERRLA, EOMI, fundi benign, throat normal without erythema or exudate  Neck: No abnormally enlarged lymph nodes or thyroid, supple  Chest: normal  Lungs: decreased air exchange bilaterally, distant lung sounds and fine end expiratory wheeze, normal percussion bilaterally, no tenderness/ rash  Heart: Regular rate and rhythm, S1S2 present or without murmur or extra heart sounds  Abdomen: obese, bowel sounds normoactive, tympanic, abdomen is soft without significant tenderness, masses, organomegaly or guarding  Extremity: Trace, pitting and bl LE edema, no cyanosis, clubbing, calf tenderness  Neuro: alert, oriented x 3, no defects noted in general exam.  Skin: Skin color,  texture, turgor normal. No rashes or lesions    Labs Reviewed:  Recent Results (from the past 24 hour(s))   EKG, 12 LEAD, INITIAL    Collection Time     01/17/14  4:52 PM       Result Value Ref Range    Ventricular Rate 120      Atrial Rate 120      P-R Interval 158      QRS Duration 78      Q-T Interval 312      QTC Calculation (Bezet) 440      Calculated P Axis 81      Calculated R Axis 21      Calculated T Axis 68      Diagnosis        Value: Sinus tachycardia  Low voltage QRS  Cannot rule out Anterior infarct , age undetermined  Abnormal ECG  No previous ECGs available     CBC WITH AUTOMATED DIFF    Collection Time     01/17/14  5:36 PM       Result Value Ref Range    WBC 11.0  4.6 - 13.2 K/uL    RBC 5.07  4.20 - 5.30 M/uL    HGB 15.8  12.0 -  16.0 g/dL    HCT 46.1 (*) 35.0 - 45.0 %    MCV 90.9  74.0 - 97.0 FL    MCH 31.2  24.0 - 34.0 PG    MCHC 34.3  31.0 - 37.0 g/dL    RDW 12.7  11.6 - 14.5 %    PLATELET 182  135 - 420 K/uL    MPV 10.7  9.2 - 11.8 FL    NEUTROPHILS 80 (*) 40 - 73 %    LYMPHOCYTES 12 (*) 21 - 52 %    MONOCYTES 5  3 - 10 %    EOSINOPHILS 3  0 - 5 %    BASOPHILS 0  0 - 2 %    ABS. NEUTROPHILS 8.8 (*) 1.8 - 8.0 K/UL    ABS. LYMPHOCYTES 1.3  0.9 - 3.6 K/UL    ABS. MONOCYTES 0.5  0.05 - 1.2 K/UL    ABS. EOSINOPHILS 0.4  0.0 - 0.4 K/UL    ABS. BASOPHILS 0.0  0.0 - 0.06 K/UL    DF AUTOMATED     METABOLIC PANEL, COMPREHENSIVE    Collection Time     01/17/14  5:36 PM       Result Value Ref Range    Sodium 138  136 - 145 mmol/L    Potassium 4.0  3.5 - 5.5 mmol/L    Chloride 95 (*) 100 - 108 mmol/L    CO2 30  21 - 32 mmol/L    Anion gap 13  3.0 - 18 mmol/L    Glucose 294 (*) 74 - 99 mg/dL    BUN 12  7.0 - 18 MG/DL    Creatinine 0.80  0.6 - 1.3 MG/DL    BUN/Creatinine ratio 15  12 - 20      GFR est AA >60  >60 ml/min/1.31m    GFR est non-AA >60  >60 ml/min/1.732m   Calcium 9.5  8.5 - 10.1 MG/DL    Bilirubin, total 0.4  0.2 - 1.0 MG/DL    ALT 64 (*) 13 - 56 U/L    AST 46 (*) 15 - 37 U/L    Alk. phosphatase  135 (*) 45 - 117 U/L    Protein, total 7.7  6.4 - 8.2 g/dL    Albumin 3.7  3.4 - 5.0 g/dL    Globulin 4.0  2.0 - 4.0 g/dL    A-G Ratio 0.9  0.8 - 1.7     D DIMER    Collection Time     01/17/14  5:36 PM       Result Value Ref Range    D DIMER 0.89 (*) <0.46 ug/ml(FEU)   POC G3    Collection Time     01/17/14  7:23 PM       Result Value Ref Range    Device: BIPAP      FIO2 (POC) 0.40      pH (POC) 7.312 (*) 7.35 - 7.45      pCO2 (POC) 60.2 (*) 35.0 - 45.0 MMHG    pO2 (POC) 184 (*) 80 - 100 MMHG    HCO3 (POC) 30.4 (*) 22 - 26 MMOL/L    sO2 (POC) 99 (*) 92 - 97 %    Base excess (POC) 4      PEEP/CPAP (POC) 5      PIP (POC) 13      Pressure support 7      Allens test (POC) N/A  Bleed In 0      Total resp. rate 28      Site RIGHT RADIAL      Patient temp. 98.6      Specimen type (POC) ARTERIAL      Performed by Delynn Flavin     CARDIAC PANEL,(CK, CKMB & TROPONIN)    Collection Time     01/17/14  7:45 PM       Result Value Ref Range    CK 37  26 - 192 U/L    CK - MB 1.2  0.5 - 3.6 ng/ml    CK-MB Index 3.2  0.0 - 4.0 %    Troponin-I, Qt. 0.03  0.00 - 0.06 NG/ML   LACTIC ACID, PLASMA    Collection Time     01/17/14  7:45 PM       Result Value Ref Range    Lactic acid 1.6  0.4 - 2.0 MMOL/L   CULTURE, BLOOD    Collection Time     01/17/14  9:00 PM       Result Value Ref Range    Special Requests: NO SPECIAL REQUESTS      Culture result: NO GROWTH AFTER 9 HOURS     CULTURE, BLOOD    Collection Time     01/17/14  9:20 PM       Result Value Ref Range    Special Requests: NO SPECIAL REQUESTS      Culture result: NO GROWTH AFTER 9 HOURS     GLUCOSE, POC    Collection Time     01/17/14 11:02 PM       Result Value Ref Range    Glucose (POC) 369 (*) 70 - 110 mg/dL   CBC W/O DIFF    Collection Time     01/18/14  5:13 AM       Result Value Ref Range    WBC 12.8  4.6 - 13.2 K/uL    RBC 4.78  4.20 - 5.30 M/uL    HGB 14.8  12.0 - 16.0 g/dL    HCT 43.7  35.0 - 45.0 %    MCV 91.4  74.0 - 97.0 FL    MCH 31.0  24.0 - 34.0 PG    MCHC  33.9  31.0 - 37.0 g/dL    RDW 12.7  11.6 - 14.5 %    PLATELET 191  135 - 420 K/uL    MPV 10.8  9.2 - 00.9 FL   METABOLIC PANEL, BASIC    Collection Time     01/18/14  5:13 AM       Result Value Ref Range    Sodium 137  136 - 145 mmol/L    Potassium 4.0  3.5 - 5.5 mmol/L    Chloride 99 (*) 100 - 108 mmol/L    CO2 27  21 - 32 mmol/L    Anion gap 11  3.0 - 18 mmol/L    Glucose 334 (*) 74 - 99 mg/dL    BUN 11  7.0 - 18 MG/DL    Creatinine 0.80  0.6 - 1.3 MG/DL    BUN/Creatinine ratio 14  12 - 20      GFR est AA >60  >60 ml/min/1.35m    GFR est non-AA >60  >60 ml/min/1.718m   Calcium 9.1  8.5 - 10.1 MG/DL   MAGNESIUM    Collection Time     01/18/14  5:13 AM       Result Value  Ref Range    Magnesium 1.7 (*) 1.8 - 2.4 mg/dL   PHOSPHORUS    Collection Time     01/18/14  5:13 AM       Result Value Ref Range    Phosphorus 2.4 (*) 2.5 - 4.9 MG/DL   GLUCOSE, POC    Collection Time     01/18/14  6:35 AM       Result Value Ref Range    Glucose (POC) 348 (*) 70 - 110 mg/dL   GLUCOSE, POC    Collection Time     01/18/14 11:30 AM       Result Value Ref Range    Glucose (POC) 306 (*) 70 - 110 mg/dL           Recent Labs      01/17/14   1923   FIO2I  0.40   HCO3I  30.4*   PCO2I  60.2*   PHI  7.312*   PO2I  184*       Special Requests:   Date Value Ref Range Status   01/17/2014 NO SPECIAL REQUESTS   Preliminary        Culture result:   Date Value Ref Range Status   01/17/2014 NO GROWTH AFTER 9 HOURS   Preliminary       Imaging:  '[x]' I have personally reviewed the patient???s chest radiographs images and report with the patient    Results from Hospital Encounter encounter on 01/17/14   XR CHEST PA LAT   Narrative Indication: Shortness of breath, history of COPD    Findings: Chest, AP and lateral. Cardiac silhouette and pulmonary vessels are  normal. Mild calcified plaque is present at the aortic arch. Lungs are slightly  hyperinflated. No focal consolidation.         Impression Impression:    No active disease.           '[x]' See my orders  for details    My assessment, plan of care, findings, medications, side effects etc were discussed with:  '[x]' nursing '[]' PT/OT    '[]' respiratory therapy '[]' Dr.   '[x]' family- at patient's request '[x]' Patient     '[x]' Total care time exclusive of procedures 57 minutes with complex decision making performed and > 50% time spent in face to face consultation.    Latroya Ng Rondall Allegra, MD

## 2014-01-18 NOTE — Progress Notes (Addendum)
0800: " We are telemetry" sheet reviwed with pt. Pt denies chest pain or sob. Pt up ad lib. On 2  liters of o2    1400 oxygen removed. Pt saturation above 90%, pt on cont oxymetry.

## 2014-01-18 NOTE — Other (Signed)
Bedside and Verbal shift change report given to Correct Care Of South CarolinaMila, RNng nurse) by Stryker CorporationEDReport included the following information SBAR, Kardex, Intake/Output, MAR and Recent Results.

## 2014-01-18 NOTE — Other (Cosign Needed)
Bedside and Verbal shift change report given to Abe PeopleNicola RN (oncoming nurse) by Jesse FallLudmila Barnett, RN   (offgoing nurse). Report included the following information SBAR and Kardex.

## 2014-01-18 NOTE — Progress Notes (Signed)
PATIENT IN NO DISTRESS PRIOR TO NEB TX.  BS DIMINISHED BUT CTA.  SPO2 99% ON 2L NC.  PLACED ON ROOM AIR.  NASAL CANNULA LEFT ON STANDBY.

## 2014-01-18 NOTE — Progress Notes (Signed)
Chaplain conducted an initial consultation and Spiritual Assessment for Sarah CopperCarolyn Cuadras, who is a 80 y.o.,female. According to the patient???s chart Religious Affiliation is: No religion.     Patient is Catholic.  Here visiting from [place] when she became ill.  Positive spirit.     The reason the Patient came to the hospital is:   Patient Active Problem List    Diagnosis Date Noted   ??? HAP (hospital-acquired pneumonia) 01/17/2014   ??? Acute respiratory failure 01/17/2014   ??? COPD (chronic obstructive pulmonary disease) 01/17/2014   ??? HTN (hypertension) 01/17/2014   ??? DM (diabetes mellitus) 01/17/2014   ??? Morbid obesity    ??? Bronchitis         The Chaplain provided the following Interventions:  Initiated a relationship of care and support.   Explored issues of faith, belief, spirituality and religious/ritual needs while hospitalized.  Listened empathically.  Provided chaplaincy education.  Provided information about Spiritual Care Services.  Offered prayer and assurance of continued prayers on patients behalf.   Chart reviewed.    The following outcomes were achieved:  Patient shared limited information about their medical narrative, spiritual journey and beliefs.  Chaplain confirmed Patient's Religious Affiliation.  Patient processed feelings about current hospitalization.  Patient expressed gratitude for Spiritual Care visit.    Assessment:  There are no significant spiritual or religious issues which require further intervention at this time.   Patient does not have any religious or cultural needs that will affect patient???s preferences in health care.    Plan:  Chaplains will continue to follow and will provide pastoral care as needed or requested.   Chaplain recommends bedside caregivers page chaplain on duty if patient shows signs of acute spiritual or emotional distress.    Chaplain Marica OtterBarbara Shefelton, M.Div.  Board Certified Chaplain  [phone] - Office

## 2014-01-18 NOTE — Progress Notes (Signed)
Hospitalist Progress Note    Patient: Sarah Livingston MRN: [id number]  CSN: [id number]    Date of Birth: [birth year]  Age: 80 y.o.  Sex: female    DOA: 01/17/2014 LOS:  LOS: 1 day            Assessment/Plan     Active Problems:    HAP (hospital-acquired pneumonia) (01/17/2014)      Acute respiratory failure (01/17/2014)      COPD (chronic obstructive pulmonary disease) (01/17/2014)      HTN (hypertension) (01/17/2014)      DM (diabetes mellitus) (01/17/2014)        Patient feeling much better, breathing better  Acute respiratory failure - now off BiPAP  COPD - continue solumedrol, inhaled BD  Possible HAP - antibiotics vancomycin, azactam, levaquin  HTN - controlled  DM - on lantus, continue SSI, uncontrolled, secondary to steroids  pulm to see  DVT prophylaxis    Review of systems  General: No fevers or chills.  Cardiovascular: No chest pain or pressure. No palpitations.   Pulmonary: see above.   Gastrointestinal: No nausea, vomiting.       Physical Exam:  General: WD, WN.  Alert, cooperative, no acute distress????  HEENT: NC, Atraumatic.  PERRLA, anicteric sclerae.  Lungs: Exp wheezes bilaterally.  Heart:  Regular  rhythm,?? No murmur, No Rubs, No Gallops  Abdomen: Soft, Non distended, Non tender. ??+Bowel sounds,   Extremities: No c/c/e  Psych:???? Not anxious or agitated.  Neurologic:?? No acute neurological deficit.           Vital signs/Intake and Output:  Visit Vitals   Item Reading   ??? BP 128/61   ??? Pulse 94   ??? Temp 97.9 ??F (36.6 ??C)   ??? Resp 22   ??? Ht 5\' 4"  (1.626 m)   ??? Wt 92.534 kg (204 lb)   ??? BMI 35.00 kg/m2   ??? SpO2 94%     Current Shift:  03/22 0700 - 03/22 1859  In: 600 [P.O.:600]  Out: 1100 [Urine:1100]  Last three shifts:         Medications Reviewed      Labs: Results:       Chemistry Recent Labs      01/18/14   0513  01/17/14   1736   GLU  334*  294*   NA  137  138   K  4.0  4.0   CL  99*  95*   CO2  27  30   BUN  11  12   CREA  0.80  0.80   CA  9.1  9.5   AGAP  11  13   BUCR  14  15   AP   --   135*   TP    --   7.7   ALB   --   3.7   GLOB   --   4.0   AGRAT   --   0.9      CBC w/Diff Recent Labs      01/18/14   0513  01/17/14   1736   WBC  12.8  11.0   RBC  4.78  5.07   HGB  14.8  15.8   HCT  43.7  46.1*   PLT  191  182   GRANS   --   80*   LYMPH   --   12*   EOS   --   3  Cardiac Enzymes Recent Labs      01/17/14   1945   CPK  37   CKND1  3.2      Coagulation No results for input(s): PTP, INR, APTT in the last 72 hours.    Lipid Panel No results found for this basename: CHOL, CHOLPOCT, [id number], [id number], [id number], CHOLX, CHOLP, CHLST, CHOLV, [id number], HDL, HDLPOCT, [id number], NHDLCT, [id number], HDLC, HDLP, LDL, LDLPOCT, [id number], NLDLCT, DLDL, LDLC, DLDLP, [id number], VLDLC, VLDL, TGL, TGLX, TRIGL, [id number], TRIGP, TGLPOCT, [id number], [id number], CHHD, CHHDX      BNP No results for input(s): BNPP in the last 72 hours.   Liver Enzymes Recent Labs      01/17/14   1736   TP  7.7   ALB  3.7   AP  135*   SGOT  46*      Thyroid Studies No results found for this basename: T4, T3U, T3RU, TSH        Procedures/imaging: see electronic medical records for all procedures/Xrays and details which were not copied into this note but were reviewed prior to creation of Plan

## 2014-01-18 NOTE — Progress Notes (Signed)
Pharmacy Dosing Services: Vancomycin    Consult for Vancomycin Dosing by Pharmacy by Dr. Denyse DagoAmer  Consult provided for this 80 y.o. year old female , for indication of HAP.  Day of Therapy 1    Ht Readings from Last 1 Encounters:   01/17/14 162.6 cm (64")        Wt Readings from Last 1 Encounters:   01/17/14 92.987 kg (205 lb)        Serum Creatinine Lab Results   Component Value Date/Time    Creatinine 0.80 01/17/2014  5:36 PM      Creatinine Clearance Estimated Creatinine Clearance: 64 mL/min (based on Cr of 0.8).   BUN Lab Results   Component Value Date/Time    BUN 12 01/17/2014  5:36 PM      WBC Lab Results   Component Value Date/Time    WBC 11.0 01/17/2014  5:36 PM      H/H Lab Results   Component Value Date/Time    HGB 15.8 01/17/2014  5:36 PM      Platelets Lab Results   Component Value Date/Time    PLATELET 182 01/17/2014  5:36 PM      Temp 98.3 ??F (36.8 ??C) ()     Initiated Vancomycin 750 mg IV q12h    Dose calculated to approximate a therapeutic trough of 15-120mcg/mL.      Pharmacy to follow daily and will make changes to dose and/or frequency based on clinical status.    Pharmacist Zadie Cleverlyavid A Ranald Alessio, PHARMD [phone]

## 2014-01-19 LAB — METABOLIC PANEL, BASIC
Anion gap: 7 mmol/L (ref 3.0–18)
BUN/Creatinine ratio: 17 (ref 12–20)
BUN: 12 MG/DL (ref 7.0–18)
CO2: 31 mmol/L (ref 21–32)
Calcium: 8.8 MG/DL (ref 8.5–10.1)
Chloride: 98 mmol/L — ABNORMAL LOW (ref 100–108)
Creatinine: 0.72 MG/DL (ref 0.6–1.3)
GFR est AA: >60 mL/min/{1.73_m2} (ref >60)
GFR est non-AA: >60 mL/min/{1.73_m2} (ref >60)
Glucose: 273 mg/dL — ABNORMAL HIGH (ref 74–99)
Potassium: 4 mmol/L (ref 3.5–5.5)
Sodium: 136 mmol/L (ref 136–145)

## 2014-01-19 LAB — GLUCOSE, POC
Glucose (POC): 298 mg/dL — ABNORMAL HIGH (ref 70–110)
Glucose (POC): 339 mg/dL — ABNORMAL HIGH (ref 70–110)
Glucose (POC): 382 mg/dL — ABNORMAL HIGH (ref 70–110)
Glucose (POC): 333 mg/dL — ABNORMAL HIGH (ref 70–110)

## 2014-01-19 LAB — CBC W/O DIFF
HCT: 38.9 % (ref 35.0–45.0)
HGB: 13.1 g/dL (ref 12.0–16.0)
MCH: 30.6 PG (ref 24.0–34.0)
MCHC: 33.7 g/dL (ref 31.0–37.0)
MCV: 90.9 FL (ref 74.0–97.0)
MPV: 10.3 FL (ref 9.2–11.8)
PLATELET: 160 10*3/uL (ref 135–420)
RBC: 4.28 M/uL (ref 4.20–5.30)
RDW: 12.6 % (ref 11.6–14.5)
WBC: 8.2 10*3/uL (ref 4.6–13.2)

## 2014-01-19 LAB — VANCOMYCIN, TROUGH: Vancomycin,trough: 4.4 ug/mL — CL (ref 10.0–20.0)

## 2014-01-19 MED ADMIN — albuterol-ipratropium (DUO-NEB) 2.5 MG-0.5 MG/3 ML: RESPIRATORY_TRACT | @ 08:00:00 | NDC 00487020101

## 2014-01-19 MED ADMIN — sodium chloride (NS) flush 5-10 mL: INTRAVENOUS | @ 18:00:00 | NDC 87701099893

## 2014-01-19 MED ADMIN — phenytoin ER (DILANTIN ER) ER capsule 200 mg: ORAL | @ 12:00:00 | NDC 00071036940

## 2014-01-19 MED ADMIN — amLODIPine (NORVASC) tablet 10 mg: ORAL | @ 12:00:00 | NDC 68084025911

## 2014-01-19 MED ADMIN — methylPREDNISolone (PF) (SOLU-MEDROL) injection 40 mg: INTRAVENOUS | @ 11:00:00 | NDC 00009004725

## 2014-01-19 MED ADMIN — albuterol-ipratropium (DUO-NEB) 2.5 MG-0.5 MG/3 ML: RESPIRATORY_TRACT | @ 19:00:00 | NDC 00487020101

## 2014-01-19 MED ADMIN — aztreonam (AZACTAM) 1 g in 0.9% sodium chloride (MBP/ADV) 100 mL MBP: INTRAVENOUS | @ 10:00:00 | NDC 63323040124

## 2014-01-19 MED ADMIN — insulin lispro (HUMALOG) injection: SUBCUTANEOUS | @ 21:00:00 | NDC 00002751017

## 2014-01-19 MED ADMIN — pantoprazole (PROTONIX) tablet 40 mg: ORAL | @ 12:00:00 | NDC 68084081311

## 2014-01-19 MED ADMIN — methylPREDNISolone (PF) (SOLU-MEDROL) injection 40 mg: INTRAVENOUS | @ 06:00:00 | NDC 00009004725

## 2014-01-19 MED ADMIN — sodium chloride (NS) flush 5-10 mL: INTRAVENOUS | @ 11:00:00 | NDC 87701099893

## 2014-01-19 MED ADMIN — albuterol-ipratropium (DUO-NEB) 2.5 MG-0.5 MG/3 ML: RESPIRATORY_TRACT | @ 16:00:00 | NDC 00487020101

## 2014-01-19 MED ADMIN — predniSONE (DELTASONE) tablet 20 mg: ORAL | @ 21:00:00 | NDC 00054001820

## 2014-01-19 MED ADMIN — sodium chloride (NS) flush 5-10 mL: INTRAVENOUS | @ 03:00:00 | NDC 87701099893

## 2014-01-19 MED ADMIN — aztreonam (AZACTAM) 1 g in 0.9% sodium chloride (MBP/ADV) 100 mL MBP: INTRAVENOUS | @ 02:00:00 | NDC 63323040124

## 2014-01-19 MED ADMIN — aztreonam (AZACTAM) 1 g in 0.9% sodium chloride (MBP/ADV) 100 mL MBP: INTRAVENOUS | @ 17:00:00 | NDC 63323040124

## 2014-01-19 MED ADMIN — insulin lispro (HUMALOG) injection: SUBCUTANEOUS | @ 16:00:00 | NDC 00002751017

## 2014-01-19 MED ADMIN — enoxaparin (LOVENOX) injection 40 mg: SUBCUTANEOUS | @ 06:00:00 | NDC 00075801401

## 2014-01-19 MED ADMIN — albuterol-ipratropium (DUO-NEB) 2.5 MG-0.5 MG/3 ML: RESPIRATORY_TRACT | @ 11:00:00 | NDC 00487020101

## 2014-01-19 MED ADMIN — albuterol-ipratropium (DUO-NEB) 2.5 MG-0.5 MG/3 ML: RESPIRATORY_TRACT | @ 04:00:00 | NDC 00487020101

## 2014-01-19 MED ADMIN — Vancomycin Trough - Draw 30 min prior to dose due at 1300 on 3/23: @ 16:00:00 | NDC 00740100004

## 2014-01-19 MED ADMIN — insulin glargine (LANTUS) injection 10 Units: SUBCUTANEOUS | @ 03:00:00 | NDC 00088222033

## 2014-01-19 MED ADMIN — methylPREDNISolone (PF) (SOLU-MEDROL) injection 40 mg: INTRAVENOUS | @ 16:00:00 | NDC 00009004725

## 2014-01-19 MED ADMIN — hydrochlorothiazide (HYDRODIURIL) tablet 25 mg: ORAL | @ 12:00:00 | NDC 68084008611

## 2014-01-19 MED ADMIN — insulin lispro (HUMALOG) injection: SUBCUTANEOUS | @ 11:00:00 | NDC 00002751017

## 2014-01-19 MED ADMIN — vancomycin (VANCOCIN) 750 mg in 0.9% sodium chloride (MBP/ADV) 250 mL ADV: INTRAVENOUS | @ 06:00:00 | NDC 00409710104

## 2014-01-19 MED ADMIN — vancomycin (VANCOCIN) 1,500 mg in 0.9% sodium chloride 500 mL IVPB: INTRAVENOUS | @ 19:00:00 | NDC 63323031461

## 2014-01-19 MED ADMIN — insulin lispro (HUMALOG) injection 5 Units: SUBCUTANEOUS | @ 21:00:00 | NDC 00002751017

## 2014-01-19 MED ADMIN — insulin lispro (HUMALOG) injection 5 Units: SUBCUTANEOUS | @ 16:00:00 | NDC 00002751017

## 2014-01-19 MED ADMIN — levofloxacin (LEVAQUIN) 750 mg in D5W IVPB: INTRAVENOUS | @ 03:00:00 | NDC 25021013283

## 2014-01-19 MED ADMIN — phenytoin ER (DILANTIN ER) ER capsule 300 mg: ORAL | @ 03:00:00 | NDC 00071036940

## 2014-01-19 MED ADMIN — insulin lispro (HUMALOG) injection: SUBCUTANEOUS | @ 03:00:00 | NDC 00002751017

## 2014-01-19 MED FILL — SOLU-MEDROL (PF) 125 MG/2 ML SOLUTION FOR INJECTION: 125 mg/2 mL | INTRAMUSCULAR | Qty: 2

## 2014-01-19 MED FILL — ADVAIR DISKUS 250 MCG-50 MCG/DOSE POWDER FOR INHALATION: 250-50 mcg/dose | RESPIRATORY_TRACT | Qty: 14

## 2014-01-19 MED FILL — LEVOFLOXACIN IN D5W 750 MG/150 ML IV PIGGY BACK: 750 mg/150 mL | INTRAVENOUS | Qty: 150

## 2014-01-19 MED FILL — INSULIN GLARGINE 100 UNIT/ML INJECTION: 100 unit/mL | SUBCUTANEOUS | Qty: 1

## 2014-01-19 MED FILL — PREDNISONE 20 MG TAB: 20 mg | ORAL | Qty: 1

## 2014-01-19 MED FILL — AMLODIPINE 5 MG TAB: 5 mg | ORAL | Qty: 2

## 2014-01-19 MED FILL — LOVENOX 40 MG/0.4 ML SUBCUTANEOUS SYRINGE: 40 mg/0.4 mL | SUBCUTANEOUS | Qty: 0.4

## 2014-01-19 MED FILL — IPRATROPIUM-ALBUTEROL 2.5 MG-0.5 MG/3 ML NEB SOLUTION: 2.5 mg-0.5 mg/3 ml | RESPIRATORY_TRACT | Qty: 3

## 2014-01-19 MED FILL — AZTREONAM 1 GRAM SOLUTION FOR INJECTION: 1 gram | INTRAMUSCULAR | Qty: 1

## 2014-01-19 MED FILL — PANTOPRAZOLE 40 MG TAB, DELAYED RELEASE: 40 mg | ORAL | Qty: 1

## 2014-01-19 MED FILL — VANCOMYCIN 10 GRAM IV SOLR: 10 gram | INTRAVENOUS | Qty: 1500

## 2014-01-19 MED FILL — VANCOMYCIN 750 MG IV SOLUTION: 750 mg | INTRAVENOUS | Qty: 750

## 2014-01-19 MED FILL — SPIRIVA WITH HANDIHALER 18 MCG AND INHALATION CAPSULES: 18 mcg | RESPIRATORY_TRACT | Qty: 5

## 2014-01-19 MED FILL — DILANTIN EXTENDED 100 MG CAPSULE: 100 mg | ORAL | Qty: 3

## 2014-01-19 MED FILL — HYDROCHLOROTHIAZIDE 25 MG TAB: 25 mg | ORAL | Qty: 1

## 2014-01-19 MED FILL — HUMALOG U-100 INSULIN 100 UNIT/ML SUBCUTANEOUS SOLUTION: 100 unit/mL | SUBCUTANEOUS | Qty: 3

## 2014-01-19 MED FILL — DILANTIN EXTENDED 100 MG CAPSULE: 100 mg | ORAL | Qty: 2

## 2014-01-19 MED FILL — PHARMACY INFORMATION NOTE: Qty: 1

## 2014-01-19 NOTE — Progress Notes (Addendum)
23:45 Shift assessment completed. See nsg flow sheet for details.    03:00 Reassessed with 0 changes noted. Resting quietly in bed with eyes closed between cares. Offers 0 c/o pain or discomfort.    07:25 Bedside and Verbal shift change report given to C Helfrich RN (oncoming nurse) by Cletis MediaK Anderson RN (offgoing nurse). Report included the following information SBAR.

## 2014-01-19 NOTE — Other (Signed)
Bedside and Verbal shift change report given to Jesse FallLudmila Barnett, RN (oncoming nurse) by Valarie ConesNicola Elsner, RN (offgoing nurse).  Report given with SBAR, Kardex, Intake/Output, MAR and Recent Results.

## 2014-01-19 NOTE — Progress Notes (Signed)
Chart reviewed and spoke with patient ,pt was traveling from [place] to visit her brother her in [place],has Hx Copd,brought all medications with her at her brothers home pt uses nebulizer ,allbuterol inhaler,advair,pt has an established pulmonologist in [place] ,PCP Dr.Eric Yoss,after discharge pt plans to stay with her brother for a few days before driving back to [place] with her husband,pt denies using home 02 will cont to monitor chart for discharge planning.

## 2014-01-19 NOTE — Progress Notes (Signed)
[facility]   Pulmonary and Sleep Medicine     Pulmonary Follow up    Patient: Natalyn Szymanowski               Sex: female          DOA: 01/17/2014       Date of Birth:  06/02/1934      Age:  80 y.o.        LOS:  LOS: 2 days        Reason for Consult: Acute hypercapnic respiratory failure, COPD exacerbation    IMPRESSION:   Acute hypercapnic respiratory failure, improved clinically on BiPAP and now on RA, no evidence of PNA  COPD (chronic obstructive pulmonary disease) exacerbation  DM (diabetes mellitus)- may get affected by steroids      RECOMMENDATIONS:   ?? Compensated respiratory status, monitor for goal SPO2 >90%  ?? On room air doing well  ?? Continue bronchodilators  ?? Taper to po steroids  ?? Start advair  ?? Start Spiriva  ?? Albuterol PRN  ?? NO evidence of pneumonia some LRTI with GPC in chains continue levaquin dc vanco and azactam  ?? Overall doing well if continue to do well ok to DC home on tapering steroids and advir spiriva  ?? Patient is from [place] and will follow up there  I spent 35 min of time excluding procedure, with face to face evaluation  >50% on complex decision making, coordination of care and counseling patient.       HPI:   Mr. Robyn Galati is a 80 y.o. female who was seen at the request of Dr. Denyse Dago for Acute hypercapnic respiratory failure, COPD exacerbation. The history was provided by the patient. She reports living in [place] and traveling here to family. She reports many years of COPD and recent exacerbation. She follows with local pulmonologist and takes Advair MDI, Albuterol MDI and nebs. She reports being cleared by primary for traveling. She reports stopping by at a local hotel/motel on her way to this area. During stay there she noticed the heating system with smell and soon she started to feels soreness of throat, chest congestion. She noticed wheezing and progressive shortness of breath when she presented to er and noted to have hypercapnia. She was started on  BiPAP and admitted. She denies any fever, chills, adenopathy, rash, chest pain, hemoptysis, phlegm, calf pain, leg swelling. She is at present on RA and feels significantly better close to her baseline.      Medications  Prior to Admission medications    Medication Sig Start Date End Date Taking? Authorizing Provider   phenytoin ER (DILANTIN EXTENDED) 100 mg ER capsule Take 200 mg by mouth daily. In the morning   Yes Historical Provider   phenytoin ER (DILANTIN EXTENDED) 100 mg ER capsule Take 300 mg by mouth daily. At hs   Yes Historical Provider   insulin glargine (LANTUS) 100 unit/mL injection 10 Units by SubCUTAneous route nightly.   Yes Historical Provider   amLODIPine (NORVASC) 10 mg tablet Take 10 mg by mouth daily.   Yes Historical Provider   atorvastatin (LIPITOR) 20 mg tablet Take 20 mg by mouth daily.   Yes Historical Provider   levothyroxine (SYNTHROID) 100 mcg tablet Take 100 mcg by mouth Daily (before breakfast).   Yes Historical Provider       Current Facility-Administered Medications   Medication Dose Route Frequency   ??? insulin lispro (HUMALOG) injection 5 Units  5 Units SubCUTAneous  TIDAC   ??? insulin glargine (LANTUS) injection 15 Units  15 Units SubCUTAneous QHS   ??? predniSONE (DELTASONE) tablet 20 mg  20 mg Oral BID WITH MEALS   ??? albuterol (PROVENTIL VENTOLIN) nebulizer solution 2.5 mg  2.5 mg Nebulization Q6H PRN   ??? fluticasone-salmeterol (ADVAIR) 28150mcg-50mcg/puff  1 Puff Inhalation BID RT   ??? [START ON 01/20/2014] tiotropium (SPIRIVA) inhalation capsule 18 mcg  1 Cap Inhalation DAILY   ??? Vancomycin - Pharmacy to dose  1 Each Other DAILY PRN   ??? phenytoin ER (DILANTIN ER) ER capsule 300 mg  300 mg Oral QHS   ??? phenytoin ER (DILANTIN ER) ER capsule 200 mg  200 mg Oral DAILY   ??? sodium chloride (NS) flush 5-10 mL  5-10 mL IntraVENous Q8H   ??? sodium chloride (NS) flush 5-10 mL  5-10 mL IntraVENous PRN   ??? levofloxacin (LEVAQUIN) 750 mg in D5W IVPB  750 mg IntraVENous Q24H   ??? enoxaparin (LOVENOX)  injection 40 mg  40 mg SubCUTAneous Q24H   ??? pantoprazole (PROTONIX) tablet 40 mg  40 mg Oral DAILY   ??? insulin lispro (HUMALOG) injection   SubCUTAneous AC&HS   ??? glucose chewable tablet 16 g  16 g Oral PRN   ??? glucagon (GLUCAGEN) injection 1 mg  1 mg IntraMUSCular PRN   ??? dextrose (D50W) injection syrg 25 g  50 mL IntraVENous PRN   ??? hydrochlorothiazide (HYDRODIURIL) tablet 25 mg  25 mg Oral DAILY   ??? amLODIPine (NORVASC) tablet 10 mg  10 mg Oral DAILY       Allergy  Allergies   Allergen Reactions   ??? Latex Rash   ??? Pcn [Penicillins] Rash       Review of Systems  General ROS: negative for - chills, fever, malaise, night sweats or weight loss  Psychological ROS: negative for - anxiety, depression or suicidal ideation  Ophthalmic ROS: negative for - eye pain, loss of vision or photophobia  ENT ROS: negative for - epistaxis, headaches, sinus pain or vocal changes      Physical Exam:   Vital Signs:    BP 128/61    Pulse 94    Temp(Src) 97.9 ??F (36.6 ??C)    Resp 22    Ht 5\' 4"  (1.626 m)    Wt 92.534 kg (204 lb)    BMI 35.00 kg/m2      SpO2 94%     O2 Device: Room Air   Temp (24hrs), Avg:98 ??F (36.7 ??C), Min:97.6 ??F (36.4 ??C), Max:98.3 ??F (36.8 ??C)     Intake/Output:   Last shift:      03/22 0700 - 03/22 1859  In: 600 [P.O.:600]  Out: 1100 [Urine:1100]  Last 3 shifts: 03/21 1900 - 03/23 0659  In: 950 [P.O.:600; I.V.:350]  Out: 1950 [Urine:1950]    Intake/Output Summary (Last 24 hours) at 01/18/14 1342  Last data filed at 01/18/14 1243   Gross per 24 hour   Intake    600 ml   Output   1100 ml   Net   -500 ml      General: in no respiratory distress and acyanotic and oriented times 3, alert, cooperative, no distress, appears stated age, moderately obese  HEENT: PERRLA, EOMI, fundi benign, throat normal without erythema or exudate  Neck: No abnormally enlarged lymph nodes or thyroid, supple  Chest: normal  Lungs: decreased air exchange bilaterally, distant lung sounds and fine end expiratory wheeze, normal percussion  bilaterally, no tenderness/  rash  Heart: Regular rate and rhythm, S1S2 present or without murmur or extra heart sounds  Abdomen: obese, bowel sounds normoactive, tympanic, abdomen is soft without significant tenderness, masses, organomegaly or guarding  Extremity: Trace, pitting and bl LE edema, no cyanosis, clubbing, calf tenderness  Neuro: alert, oriented x 3, no defects noted in general exam.  Skin: Skin color, texture, turgor normal. No rashes or lesions    Labs Reviewed:  Recent Results (from the past 24 hour(s))   GLUCOSE, POC    Collection Time     01/18/14  3:57 PM       Result Value Ref Range    Glucose (POC) 340 (*) 70 - 110 mg/dL   CULTURE, RESPIRATORY/SPUTUM/BRONCH W GRAM STAIN    Collection Time     01/18/14  5:30 PM       Result Value Ref Range    Special Requests: NO SPECIAL REQUESTS      GRAM STAIN >25 WBC/lpf      GRAM STAIN <10 EPI/lpf      GRAM STAIN MUCUS PRESENT      GRAM STAIN        Value: FEW  GRAM POSITIVE COCCI  IN PAIRS  IN CHAINS      Culture result:        Value: MANY  NORMAL RESPIRATORY FLORA     GLUCOSE, POC    Collection Time     01/18/14  9:11 PM       Result Value Ref Range    Glucose (POC) 298 (*) 70 - 110 mg/dL   CBC W/O DIFF    Collection Time     01/19/14  4:40 AM       Result Value Ref Range    WBC 8.2  4.6 - 13.2 K/uL    RBC 4.28  4.20 - 5.30 M/uL    HGB 13.1  12.0 - 16.0 g/dL    HCT 78.4  69.6 - 29.5 %    MCV 90.9  74.0 - 97.0 FL    MCH 30.6  24.0 - 34.0 PG    MCHC 33.7  31.0 - 37.0 g/dL    RDW 28.4  13.2 - 44.0 %    PLATELET 160  135 - 420 K/uL    MPV 10.3  9.2 - 11.8 FL   METABOLIC PANEL, BASIC    Collection Time     01/19/14  4:40 AM       Result Value Ref Range    Sodium 136  136 - 145 mmol/L    Potassium 4.0  3.5 - 5.5 mmol/L    Chloride 98 (*) 100 - 108 mmol/L    CO2 31  21 - 32 mmol/L    Anion gap 7  3.0 - 18 mmol/L    Glucose 273 (*) 74 - 99 mg/dL    BUN 12  7.0 - 18 MG/DL    Creatinine 1.02  0.6 - 1.3 MG/DL    BUN/Creatinine ratio 17  12 - 20      GFR est AA >60  >60  ml/min/1.26m2    GFR est non-AA >60  >60 ml/min/1.37m2    Calcium 8.8  8.5 - 10.1 MG/DL   GLUCOSE, POC    Collection Time     01/19/14  6:26 AM       Result Value Ref Range    Glucose (POC) 339 (*) 70 - 110 mg/dL   GLUCOSE, POC    Collection Time  01/19/14 11:07 AM       Result Value Ref Range    Glucose (POC) 382 (*) 70 - 110 mg/dL   VANCOMYCIN, TROUGH    Collection Time     01/19/14  1:25 PM       Result Value Ref Range    Vancomycin,trough 4.4 (*) 10.0 - 20.0 ug/mL           Recent Labs      01/17/14   1923   FIO2I  0.40   HCO3I  30.4*   PCO2I  60.2*   PHI  7.312*   PO2I  184*       Special Requests:   Date Value Ref Range Status   01/18/2014 NO SPECIAL REQUESTS   Preliminary        Culture result:   Date Value Ref Range Status   01/18/2014 MANY  NORMAL RESPIRATORY FLORA     Preliminary       Imaging:  [x] I have personally reviewed the patient???s chest radiographs images and report with the patient    Results from Hospital Encounter encounter on 01/17/14   XR CHEST PA LAT   Narrative Indication: Shortness of breath, history of COPD    Findings: Chest, AP and lateral. Cardiac silhouette and pulmonary vessels are  normal. Mild calcified plaque is present at the aortic arch. Lungs are slightly  hyperinflated. No focal consolidation.         Impression Impression:    No active disease.          Ilean Skill, MD

## 2014-01-19 NOTE — Other (Signed)
Bedside and Verbal shift change report given to Nicola E. RN (oncoming nurse) by Ludmila Barnett, RN   (offgoing nurse). Report included the following information SBAR and Kardex.

## 2014-01-19 NOTE — Progress Notes (Signed)
Telemetry sheet reviewed with patient and/or family. Opportunities for questions provided. Understanding verbalized.    0808-No acute changes overnight. Pt rested quietly. VSS as noted in doc flow.

## 2014-01-19 NOTE — Progress Notes (Addendum)
0720: we are telemetry sheet reviewed with pt. Pt denies sob or cp. Call light within reach.  1145: spoke with Dr. Denyse DagoAmer about high blood sugars, see new orders.     1250: lab call to draw vancomycin trough    1325: rounded with Dr. Denyse DagoAmer

## 2014-01-19 NOTE — Progress Notes (Signed)
Hospitalist Progress Note    Patient: Sarah CopperCarolyn Livingston MRN: [id number]  CSN: [id number]    Date of Birth: 03/02/1934  Age: 80 y.o.  Sex: female    DOA: 01/17/2014 LOS:  LOS: 2 days            Assessment/Plan     Active Problems:    HAP (hospital-acquired pneumonia) (01/17/2014)      Acute respiratory failure (01/17/2014)      COPD (chronic obstructive pulmonary disease) (01/17/2014)      HTN (hypertension) (01/17/2014)      DM (diabetes mellitus) (01/17/2014)        Patient feeling much better, breathing better  Acute respiratory failure - now off BiPAP  COPD - continue solumedrol, inhaled BD  pneumonia - do not think she has HAP continue levaquin, discontinue azactam and vancomycin.  HTN - controlled  DM - on lantus, continue SSI, uncontrolled secondary to steroids  pulm following  DVT prophylaxis    Review of systems  General: No fevers or chills.  Cardiovascular: No chest pain or pressure. No palpitations.   Pulmonary: see above.   Gastrointestinal: No nausea, vomiting.       Physical Exam:  General: WD, WN. Alert, cooperative, no acute distress   HEENT: NC, Atraumatic. PERRLA, anicteric sclerae.  Lungs: Exp wheezes bilaterally.  Heart: Regular rhythm, No murmur, No Rubs, No Gallops  Abdomen: Soft, Non distended, Non tender. +Bowel sounds,   Extremities: No c/c/e  Psych: Not anxious or agitated.  Neurologic: No acute neurological deficit.         Vital signs/Intake and Output:  Visit Vitals   Item Reading   ??? BP 141/51   ??? Pulse 95   ??? Temp 97.9 ??F (36.6 ??C)   ??? Resp 18   ??? Ht 5\' 4"  (1.626 m)   ??? Wt 92.987 kg (205 lb)   ??? BMI 35.17 kg/m2   ??? SpO2 91%     Current Shift:  03/23 0700 - 03/23 1859  In: 1250 [P.O.:1250]  Out: 1400 [Urine:1400]  Last three shifts:  03/21 1900 - 03/23 0659  In: 950 [P.O.:600; I.V.:350]  Out: 1950 [Urine:1950]      Medications Reviewed      Labs: Results:       Chemistry Recent Labs      01/19/14   0440  01/18/14   0513  01/17/14   1736   GLU  273*  334*  294*   NA  136  137  138   K  4.0   4.0  4.0   CL  98*  99*  95*   CO2  31  27  30    BUN  12  11  12    CREA  0.72  0.80  0.80   CA  8.8  9.1  9.5   AGAP  7  11  13    BUCR  17  14  15    AP   --    --   135*   TP   --    --   7.7   ALB   --    --   3.7   GLOB   --    --   4.0   AGRAT   --    --   0.9      CBC w/Diff Recent Labs      01/19/14   0440  01/18/14   0513  01/17/14   1736   WBC  8.2  12.8  11.0   RBC  4.28  4.78  5.07   HGB  13.1  14.8  15.8   HCT  38.9  43.7  46.1*   PLT  160  191  182   GRANS   --    --   80*   LYMPH   --    --   12*   EOS   --    --   3      Cardiac Enzymes Recent Labs      01/17/14   1945   CPK  37   CKND1  3.2      Coagulation No results for input(s): PTP, INR, APTT in the last 72 hours.    Lipid Panel No results found for this basename: CHOL, CHOLPOCT, [id number], [id number], [id number], CHOLX, CHOLP, CHLST, CHOLV, [id number], HDL, HDLPOCT, [id number], NHDLCT, [id number], HDLC, HDLP, LDL, LDLPOCT, [id number], NLDLCT, DLDL, LDLC, DLDLP, [id number], VLDLC, VLDL, TGL, TGLX, TRIGL, [id number], TRIGP, TGLPOCT, [id number], [id number], CHHD, CHHDX      BNP No results for input(s): BNPP in the last 72 hours.   Liver Enzymes Recent Labs      01/17/14   1736   TP  7.7   ALB  3.7   AP  135*   SGOT  46*      Thyroid Studies No results found for this basename: T4, T3U, T3RU, TSH        Procedures/imaging: see electronic medical records for all procedures/Xrays and details which were not copied into this note but were reviewed prior to creation of Plan

## 2014-01-19 NOTE — Progress Notes (Signed)
Pharmacy Dosing Services: Vancomycin    Current regimen: Vancomycin 750mg  IV Q12H    Recent Level: 4.4 @ 1325 3/23  SCr = 0.72; CrCl = 71 ml/min    Adjustments: Increase Vancomycin to 1500mg  IV Q12H, starting at 1500 today.    Cecille PoMary G Eaker, PharmD         Contact: [phone]

## 2014-01-20 LAB — CULTURE, RESPIRATORY/SPUTUM/BRONCH W GRAM STAIN
Culture result:: NORMAL
GRAM STAIN: <10
GRAM STAIN: >25

## 2014-01-20 LAB — EKG, 12 LEAD, INITIAL
Atrial Rate: 120 {beats}/min
Calculated P Axis: 81 degrees
Calculated R Axis: 21 degrees
Calculated T Axis: 68 degrees
P-R Interval: 158 ms
Q-T Interval: 312 ms
QRS Duration: 78 ms
QTC Calculation (Bezet): 440 ms
Ventricular Rate: 120 {beats}/min

## 2014-01-20 LAB — GLUCOSE, POC
Glucose (POC): 283 mg/dL — ABNORMAL HIGH (ref 70–110)
Glucose (POC): 254 mg/dL — ABNORMAL HIGH (ref 70–110)
Glucose (POC): 217 mg/dL — ABNORMAL HIGH (ref 70–110)

## 2014-01-20 MED ORDER — FLUTICASONE-SALMETEROL 250 MCG-50 MCG/DOSE DISK DEVICE FOR INHALATION
250-50 mcg/dose | Freq: Two times a day (BID) | RESPIRATORY_TRACT | Status: AC
Start: 2014-01-20 — End: ?

## 2014-01-20 MED ORDER — HYDROCHLOROTHIAZIDE 25 MG TAB
25 mg | ORAL_TABLET | Freq: Every day | ORAL | Status: AC
Start: 2014-01-20 — End: ?

## 2014-01-20 MED ORDER — LEVOFLOXACIN 750 MG TAB
750 mg | ORAL_TABLET | Freq: Every day | ORAL | Status: AC
Start: 2014-01-20 — End: 2014-01-23

## 2014-01-20 MED ORDER — TIOTROPIUM BROMIDE 18 MCG CAPS WITH INHALATION DEVICE
18 mcg | ORAL_CAPSULE | Freq: Every day | RESPIRATORY_TRACT | Status: AC
Start: 2014-01-20 — End: ?

## 2014-01-20 MED ORDER — INSULIN LISPRO 100 UNIT/ML INJECTION
100 unit/mL | SUBCUTANEOUS | Status: AC
Start: 2014-01-20 — End: ?

## 2014-01-20 MED ORDER — ALBUTEROL SULFATE HFA 90 MCG/ACTUATION AEROSOL INHALER
90 mcg/actuation | RESPIRATORY_TRACT | Status: AC | PRN
Start: 2014-01-20 — End: ?

## 2014-01-20 MED ORDER — PREDNISONE 10 MG TAB
10 mg | ORAL_TABLET | ORAL | Status: AC
Start: 2014-01-20 — End: ?

## 2014-01-20 MED ADMIN — predniSONE (DELTASONE) tablet 20 mg: ORAL | @ 13:00:00 | NDC 00054001820

## 2014-01-20 MED ADMIN — insulin lispro (HUMALOG) injection: SUBCUTANEOUS | @ 15:00:00 | NDC 00002751017

## 2014-01-20 MED ADMIN — sodium chloride (NS) flush 5-10 mL: INTRAVENOUS | @ 09:00:00 | NDC 87701099893

## 2014-01-20 MED ADMIN — pantoprazole (PROTONIX) tablet 40 mg: ORAL | @ 13:00:00 | NDC 68084081311

## 2014-01-20 MED ADMIN — fluticasone-salmeterol (ADVAIR) 250mcg-50mcg/puff: RESPIRATORY_TRACT | @ 02:00:00 | NDC 00173069604

## 2014-01-20 MED ADMIN — levofloxacin (LEVAQUIN) 750 mg in D5W IVPB: INTRAVENOUS | @ 02:00:00 | NDC 25021013268

## 2014-01-20 MED ADMIN — phenytoin ER (DILANTIN ER) ER capsule 300 mg: ORAL | @ 02:00:00 | NDC 00071036940

## 2014-01-20 MED ADMIN — amLODIPine (NORVASC) tablet 10 mg: ORAL | @ 13:00:00 | NDC 68084025911

## 2014-01-20 MED ADMIN — insulin lispro (HUMALOG) injection: SUBCUTANEOUS | @ 02:00:00 | NDC 00002751017

## 2014-01-20 MED ADMIN — hydrochlorothiazide (HYDRODIURIL) tablet 25 mg: ORAL | @ 13:00:00 | NDC 68084008611

## 2014-01-20 MED ADMIN — insulin lispro (HUMALOG) injection 5 Units: SUBCUTANEOUS | @ 13:00:00 | NDC 00002751017

## 2014-01-20 MED ADMIN — insulin lispro (HUMALOG) injection: SUBCUTANEOUS | @ 11:00:00 | NDC 00002751017

## 2014-01-20 MED ADMIN — tiotropium (SPIRIVA) inhalation capsule 18 mcg: RESPIRATORY_TRACT | @ 13:00:00 | NDC 00597007575

## 2014-01-20 MED ADMIN — phenytoin ER (DILANTIN ER) ER capsule 200 mg: ORAL | @ 13:00:00 | NDC 00071036940

## 2014-01-20 MED ADMIN — insulin glargine (LANTUS) injection 15 Units: SUBCUTANEOUS | @ 02:00:00 | NDC 00088222033

## 2014-01-20 MED ADMIN — fluticasone-salmeterol (ADVAIR) 250mcg-50mcg/puff: RESPIRATORY_TRACT | @ 13:00:00 | NDC 00173069604

## 2014-01-20 MED ADMIN — enoxaparin (LOVENOX) injection 40 mg: SUBCUTANEOUS | @ 04:00:00 | NDC 00075801401

## 2014-01-20 MED ADMIN — insulin lispro (HUMALOG) injection 5 Units: SUBCUTANEOUS | @ 15:00:00 | NDC 00002751017

## 2014-01-20 MED FILL — PREDNISONE 20 MG TAB: 20 mg | ORAL | Qty: 1

## 2014-01-20 MED FILL — LOVENOX 40 MG/0.4 ML SUBCUTANEOUS SYRINGE: 40 mg/0.4 mL | SUBCUTANEOUS | Qty: 0.4

## 2014-01-20 MED FILL — VENTOLIN HFA 90 MCG/ACTUATION AEROSOL INHALER: 90 mcg/actuation | RESPIRATORY_TRACT | Qty: 8

## 2014-01-20 MED FILL — DILANTIN EXTENDED 100 MG CAPSULE: 100 mg | ORAL | Qty: 3

## 2014-01-20 MED FILL — INSULIN GLARGINE 100 UNIT/ML INJECTION: 100 unit/mL | SUBCUTANEOUS | Qty: 1

## 2014-01-20 MED FILL — PANTOPRAZOLE 40 MG TAB, DELAYED RELEASE: 40 mg | ORAL | Qty: 1

## 2014-01-20 MED FILL — HYDROCHLOROTHIAZIDE 25 MG TAB: 25 mg | ORAL | Qty: 1

## 2014-01-20 MED FILL — DILANTIN EXTENDED 100 MG CAPSULE: 100 mg | ORAL | Qty: 2

## 2014-01-20 MED FILL — LEVOFLOXACIN IN D5W 750 MG/150 ML IV PIGGY BACK: 750 mg/150 mL | INTRAVENOUS | Qty: 150

## 2014-01-20 MED FILL — AMLODIPINE 5 MG TAB: 5 mg | ORAL | Qty: 2

## 2014-01-20 NOTE — Other (Addendum)
Recommend: Consider increasing Humalog to 8 units AC.  Evaluate glucose carefully for insulin changes tomorrow as steroids have been decreased.  Need A1C-none on file  Current insulin /diabetes related orders: Lantus 15 units 1x/day, Humalog 5 units with meals, corrective coverage, prednisone 20 mg 2x/day  Total Daily Dose of insulin from last 24 hours: 73 units, 140 mg steroids (IV & PO)  PTA List of diabetic medications: Lantus 10 units nightly      Lab Results   Component Value Date/Time    Glucose 273 01/19/2014  4:40 AM    Glucose (POC) 254 01/20/2014  6:17 AM     No results found for this basename: HBA1C, HGBE8, HA1CLT, HBA1CPOC     Lab Results   Component Value Date/Time    Creatinine 0.72 01/19/2014  4:40 AM     Body mass index is 35.31 kg/(m^2).

## 2014-01-20 NOTE — Progress Notes (Signed)
Pt given all discharge instructions and prescriptions.  All questions answered for patient.  Patient to be discharged to home with husband.

## 2014-01-20 NOTE — Discharge Summary (Signed)
Discharge Summary    Patient: Sarah Livingston MRN: [id number]  CSN: [id number]    Date of Birth: 02-Mar-1934  Age: 80 y.o.  Sex: female    DOA: 01/17/2014 LOS:  LOS: 3 days   Discharge Date: 01/20/2014     Primary Care Provider:  Phys Other, MD    Admission Diagnoses: HAP (hospital-acquired pneumonia)    Discharge Diagnoses:    Problem List as of 01/20/2014 Never Reviewed        ICD-9-CM Class Noted - Resolved     Morbid obesity 278.01  Unknown - Present         Bronchitis 490  Unknown - Present         HAP (hospital-acquired pneumonia) 486  01/17/2014 - Present         Acute respiratory failure 518.81  01/17/2014 - Present         COPD (chronic obstructive pulmonary disease) 496  01/17/2014 - Present         HTN (hypertension) 401.9  01/17/2014 - Present         DM (diabetes mellitus) 250.00  01/17/2014 - Present              Discharge Condition: Good    PHYSICAL EXAM   Visit Vitals   Item Reading   ??? BP 118/71   ??? Pulse 70   ??? Temp 97.8 ??F (36.6 ??C)   ??? Resp 16   ??? Ht 5\' 4"  (1.626 m)   ??? Wt 93.35 kg (205 lb 12.8 oz)   ??? BMI 35.31 kg/m2   ??? SpO2 96%     General: WD, WN.  Alert, cooperative, no acute distress????  HEENT: NC, Atraumatic.  PERRLA, EOMI. Anicteric sclerae.  Lungs:  CTA Bilaterally. No Wheezing/Rhonchi/Rales.  Heart:  Regular  rhythm,?? No murmur, No Rubs, No Gallops  Abdomen: Soft, Non distended, Non tender. +Bowel sounds,   Extremities: No c/c/e  Psych:???? Not anxious or agitated.  Neurologic:?? No acute neurological deficits.                                   Hospital Course  Admission HPI : Sarah CopperCarolyn Livingston is a 80 y.o. female who has past history of COPD, DM, HTN presents to ER with complains of SOB that started since yesterday. Patient came to this area from [place] yesterday. She was hospitalized in [place] for COPD exacerbation and discharged about a week ago. Patient reports that she was doing fine and drove from [place] yesterday. She has been getting more and more SOB and presented to ER. She is on  advair and has been using albuterol without relief. She denies any chest pain, palpitations, fever/chills, N/V.   In ER ABG showed 7.3/60.2/184/30.4 on BiPAP. BP of 237/122, pulse of 131, respirations of 25, initial oxygen sats at 86%. CXR showed possible LLL pneumonia. She is being admitted for further management.     Acute respiratory failure - secondary to COPD, was started on BiPAP and weaned as tolerated. She was seen and followed by pulmonary critical care.   COPD exacerbation - started on solumedrol, inhaled bronchodilators with improvement in her breathing. Pulmonary started her on advair and spiriva. She is now feeling much better and cleared from pulmonary standpoint to be discharged.  Possible pneumonia - initially thought to be hospital acquired. However blood and respiratory cultures were not impressive, patient respiratory and general condition did  not seem like hospital acquired pneumonia. Her antibiotics changed to levaquin oral. She will complete 7 day course.   DM - her blood sugars uncontrolled secondary to steroid use. We used her basal insuling, started on meal coverage and SSI. Advised to continue SSI until she completes prednisone taper dose.   She will follow up with her PCP in [place].    Consults: Pulmonary/Critical Care      Labs: Results:       Chemistry Recent Labs      01/19/14   0440  01/18/14   0513   GLU  273*  334*   NA  136  137   K  4.0  4.0   CL  98*  99*   CO2  31  27   BUN  12  11   CREA  0.72  0.80   CA  8.8  9.1   AGAP  7  11   BUCR  17  14      CBC w/Diff Recent Labs      01/19/14   0440  01/18/14   0513   WBC  8.2  12.8   RBC  4.28  4.78   HGB  13.1  14.8   HCT  38.9  43.7   PLT  160  191      Cardiac Enzymes Recent Labs      01/17/14   1945   CPK  37   CKND1  3.2      Coagulation No results for input(s): PTP, INR, APTT in the last 72 hours.    Lipid Panel No results found for this basename: CHOL, CHOLPOCT, [id number], [id number], [id number], CHOLX, CHOLP, CHLST, CHOLV, [id number],  HDL, HDLPOCT, [id number], NHDLCT, [id number], HDLC, HDLP, LDL, LDLPOCT, [id number], NLDLCT, DLDL, LDLC, DLDLP, [id number], VLDLC, VLDL, TGL, TGLX, TRIGL, [id number], TRIGP, TGLPOCT, [id number], 884256, CHHD, CHHDX      BNP No results for input(s): BNPP in the last 72 hours.   Liver Enzymes No results for input(s): TP, ALB, TBIL, AP, SGOT, GPT in the last 72 hours.    Invalid input(s): DBIL   Thyroid Studies No results found for this basename: T4, T3U, T3RU, TSH            Significant Diagnostic Studies: Xr Chest Pa Lat    01/18/2014   Indication: Shortness of breath, history of COPD  Findings: Chest, AP and lateral. Cardiac silhouette and pulmonary vessels are normal. Mild calcified plaque is present at the aortic arch. Lungs are slightly hyperinflated. No focal consolidation.     01/18/2014   Impression:  No active disease.       Discharge Medications:     Discharge Medication List as of 01/20/2014  1:42 PM      START taking these medications    Details   insulin lispro (HUMALOG) 100 unit/mL injection For Blood Sugar (mg/dL) of:     Less than 213 =   0 units           150 -199 =   2 units  200 -249 =   4 units  250 -299 =   6 units  300 -349 =   8 units  350 and above =   10 units call MD, Print, Disp-1 Vial, R-0      fluticasone-salmeterol (ADVAIR) 250-50 mcg/dose diskus inhaler Take 1 Puff by inhalation two (2) times a day., Print, Disp-1 Inhaler, R-2      tiotropium (SPIRIVA) 18 mcg inhalation capsule  Take 1 Cap by inhalation daily., Print, Disp-30 Cap, R-2      albuterol (PROVENTIL HFA, VENTOLIN HFA, PROAIR HFA) 90 mcg/actuation inhaler Take 2 Puffs by inhalation every four (4) hours as needed for Wheezing or Shortness of Breath., Print, Disp-1 Inhaler, R-2      hydrochlorothiazide (HYDRODIURIL) 25 mg tablet Take 1 Tab by mouth daily., Print, Disp-30 Tab, R-2      predniSONE (DELTASONE) 10 mg tablet Prednisone 10mg  tabs: p.o.  4 tabs daily for 2 days then drop to   3 tabs daily for 2 days then drop to   2 tabs daily for 2 days then  drop to   1 tab daily for 2 days then stop.    Dispense 20 tabs, Print, Disp-20 Tab, R-0      levofloxacin (LEVAQUIN) 750 mg tablet Take 1 Tab by mouth daily for 3 days., Print, Disp-3 Tab, R-0         CONTINUE these medications which have NOT CHANGED    Details   !! phenytoin ER (DILANTIN EXTENDED) 100 mg ER capsule Take 200 mg by mouth daily. In the morning, Historical Med      !! phenytoin ER (DILANTIN EXTENDED) 100 mg ER capsule Take 300 mg by mouth daily. At hs, Historical Med      insulin glargine (LANTUS) 100 unit/mL injection 10 Units by SubCUTAneous route nightly., Historical Med      amLODIPine (NORVASC) 10 mg tablet Take 10 mg by mouth daily., Historical Med      atorvastatin (LIPITOR) 20 mg tablet Take 20 mg by mouth daily., Historical Med      levothyroxine (SYNTHROID) 100 mcg tablet Take 100 mcg by mouth Daily (before breakfast)., Historical Med       !! - Potential duplicate medications found. Please discuss with provider.          Activity: Activity as tolerated    Diet: Diabetic Diet    Wound Care: None needed    Follow-up: PCP    Minutes spent on discharge: 50         CC: Phys Other, MD

## 2014-01-20 NOTE — Progress Notes (Addendum)
[facility]   Pulmonary and Sleep Medicine     Pulmonary Follow up    Patient: Sarah Livingston               Sex: female          DOA: 01/17/2014       Date of Birth:  15-May-1934      Age:  80 y.o.        LOS:  LOS: 3 days        Reason for Consult: Acute hypercapnic respiratory failure, COPD exacerbation    IMPRESSION:   Acute hypercapnic respiratory failure, improved clinically on BiPAP and now on RA, no evidence of PNA  COPD (chronic obstructive pulmonary disease) exacerbation  DM (diabetes mellitus)- may get affected by steroids      RECOMMENDATIONS:   ?? Compensated respiratory status, monitor for goal SPO2 >90%  ?? On room air doing well  ?? Continue bronchodilators  ?? Taper to po steroids  ?? DC on advair and spiriva  ?? Albuterol PRN  ?? NO evidence of pneumonia some LRTI with GPC in chains continue levaquin dc vanco and azactam  ?? Possible dc today   ?? Patient is from [place] and will follow up there  I spent 25 min of time excluding procedure, with face to face evaluation  >50% on complex decision making, coordination of care and counseling patient.       HPI:   Mr. Sarah Livingston is a 80 y.o. female who was seen at the request of Dr. Denyse Dago for Acute hypercapnic respiratory failure, COPD exacerbation. The history was provided by the patient. She reports living in [place] and traveling here to family. She reports many years of COPD and recent exacerbation. She follows with local pulmonologist and takes Advair MDI, Albuterol MDI and nebs. She reports being cleared by primary for traveling. She reports stopping by at a local hotel/motel on her way to this area. During stay there she noticed the heating system with smell and soon she started to feels soreness of throat, chest congestion. She noticed wheezing and progressive shortness of breath when she presented to er and noted to have hypercapnia. She was started on BiPAP and admitted. She denies any fever, chills, adenopathy, rash, chest pain,  hemoptysis, phlegm, calf pain, leg swelling. She is at present on RA and feels significantly better close to her baseline.    Feels better some cough but much better wants to go home  Medications  Prior to Admission medications    Medication Sig Start Date End Date Taking? Authorizing Provider   insulin lispro (HUMALOG) 100 unit/mL injection For Blood Sugar (mg/dL) of:     Less than 161 =   0 units           150 -199 =   2 units  200 -249 =   4 units  250 -299 =   6 units  300 -349 =   8 units  350 and above =   10 units call MD 01/20/14  Yes Mohammed Corbin Ade, MD   fluticasone-salmeterol (ADVAIR) 250-50 mcg/dose diskus inhaler Take 1 Puff by inhalation two (2) times a day. 01/20/14  Yes Mohammed Corbin Ade, MD   tiotropium (SPIRIVA) 18 mcg inhalation capsule Take 1 Cap by inhalation daily. 01/20/14  Yes Mohammed Ashrafuddin Denyse Dago, MD   albuterol (PROVENTIL HFA, VENTOLIN HFA, PROAIR HFA) 90 mcg/actuation inhaler Take 2 Puffs by inhalation every four (4) hours as needed for Wheezing  or Shortness of Breath. 01/20/14  Yes Mohammed Corbin Ade, MD   hydrochlorothiazide (HYDRODIURIL) 25 mg tablet Take 1 Tab by mouth daily. 01/20/14  Yes Mohammed Corbin Ade, MD   predniSONE (DELTASONE) 10 mg tablet Prednisone 10mg  tabs: p.o.  4 tabs daily for 2 days then drop to   3 tabs daily for 2 days then drop to   2 tabs daily for 2 days then drop to   1 tab daily for 2 days then stop.    Dispense 20 tabs 01/20/14  Yes Mohammed Corbin Ade, MD   levofloxacin (LEVAQUIN) 750 mg tablet Take 1 Tab by mouth daily for 3 days. 01/20/14 01/23/14 Yes Mohammed Corbin Ade, MD   phenytoin ER (DILANTIN EXTENDED) 100 mg ER capsule Take 200 mg by mouth daily. In the morning   Yes Historical Provider   phenytoin ER (DILANTIN EXTENDED) 100 mg ER capsule Take 300 mg by mouth daily. At hs   Yes Historical Provider   insulin glargine (LANTUS) 100 unit/mL injection 10 Units by SubCUTAneous route nightly.   Yes Historical  Provider   amLODIPine (NORVASC) 10 mg tablet Take 10 mg by mouth daily.   Yes Historical Provider   atorvastatin (LIPITOR) 20 mg tablet Take 20 mg by mouth daily.   Yes Historical Provider   levothyroxine (SYNTHROID) 100 mcg tablet Take 100 mcg by mouth Daily (before breakfast).   Yes Historical Provider       Current Facility-Administered Medications   Medication Dose Route Frequency   ??? albuterol (PROVENTIL HFA, VENTOLIN HFA, PROAIR HFA) inhaler 2 Puff  2 Puff Inhalation Q4H PRN   ??? insulin lispro (HUMALOG) injection 5 Units  5 Units SubCUTAneous TIDAC   ??? insulin glargine (LANTUS) injection 15 Units  15 Units SubCUTAneous QHS   ??? predniSONE (DELTASONE) tablet 20 mg  20 mg Oral BID WITH MEALS   ??? albuterol (PROVENTIL VENTOLIN) nebulizer solution 2.5 mg  2.5 mg Nebulization Q6H PRN   ??? fluticasone-salmeterol (ADVAIR) 210mcg-50mcg/puff  1 Puff Inhalation BID RT   ??? tiotropium (SPIRIVA) inhalation capsule 18 mcg  1 Cap Inhalation DAILY   ??? phenytoin ER (DILANTIN ER) ER capsule 300 mg  300 mg Oral QHS   ??? phenytoin ER (DILANTIN ER) ER capsule 200 mg  200 mg Oral DAILY   ??? sodium chloride (NS) flush 5-10 mL  5-10 mL IntraVENous Q8H   ??? sodium chloride (NS) flush 5-10 mL  5-10 mL IntraVENous PRN   ??? levofloxacin (LEVAQUIN) 750 mg in D5W IVPB  750 mg IntraVENous Q24H   ??? enoxaparin (LOVENOX) injection 40 mg  40 mg SubCUTAneous Q24H   ??? pantoprazole (PROTONIX) tablet 40 mg  40 mg Oral DAILY   ??? insulin lispro (HUMALOG) injection   SubCUTAneous AC&HS   ??? glucose chewable tablet 16 g  16 g Oral PRN   ??? glucagon (GLUCAGEN) injection 1 mg  1 mg IntraMUSCular PRN   ??? dextrose (D50W) injection syrg 25 g  50 mL IntraVENous PRN   ??? hydrochlorothiazide (HYDRODIURIL) tablet 25 mg  25 mg Oral DAILY   ??? amLODIPine (NORVASC) tablet 10 mg  10 mg Oral DAILY       Allergy  Allergies   Allergen Reactions   ??? Latex Rash   ??? Pcn [Penicillins] Rash       Review of Systems  General ROS: negative for - chills, fever, malaise, night sweats or  weight loss  Psychological ROS: negative for - anxiety, depression or suicidal ideation  Ophthalmic ROS: negative for - eye pain, loss of vision or photophobia  ENT ROS: negative for - epistaxis, headaches, sinus pain or vocal changes      Physical Exam:   Vital Signs:    BP 128/61    Pulse 94    Temp(Src) 97.9 ??F (36.6 ??C)    Resp 22    Ht 5\' 4"  (1.626 m)    Wt 92.534 kg (204 lb)    BMI 35.00 kg/m2      SpO2 94%     O2 Device: Room Air   Temp (24hrs), Avg:98 ??F (36.7 ??C), Min:97.6 ??F (36.4 ??C), Max:98.3 ??F (36.8 ??C)     Intake/Output:   Last shift:      03/22 0700 - 03/22 1859  In: 600 [P.O.:600]  Out: 1100 [Urine:1100]  Last 3 shifts: 03/22 1900 - 03/24 0659  In: 2510 [P.O.:1910; I.V.:600]  Out: 3550 [Urine:3550]    Intake/Output Summary (Last 24 hours) at 01/18/14 1342  Last data filed at 01/18/14 1243   Gross per 24 hour   Intake    600 ml   Output   1100 ml   Net   -500 ml      General: in no respiratory distress and acyanotic and oriented times 3, alert, cooperative, no distress, appears stated age, moderately obese  HEENT: PERRLA, EOMI, fundi benign, throat normal without erythema or exudate  Neck: No abnormally enlarged lymph nodes or thyroid, supple  Chest: normal  Lungs: decreased air exchange bilaterally, distant lung sounds and fine end expiratory wheeze, normal percussion bilaterally, no tenderness/ rash  Heart: Regular rate and rhythm, S1S2 present or without murmur or extra heart sounds  Abdomen: obese, bowel sounds normoactive, tympanic, abdomen is soft without significant tenderness, masses, organomegaly or guarding  Extremity: Trace, pitting and bl LE edema, no cyanosis, clubbing, calf tenderness  Neuro: alert, oriented x 3, no defects noted in general exam.  Skin: Skin color, texture, turgor normal. No rashes or lesions    Labs Reviewed:  Recent Results (from the past 24 hour(s))   GLUCOSE, POC    Collection Time     01/19/14  4:07 PM       Result Value Ref Range    Glucose (POC) 333 (*) 70 - 110  mg/dL   GLUCOSE, POC    Collection Time     01/19/14  9:25 PM       Result Value Ref Range    Glucose (POC) 283 (*) 70 - 110 mg/dL   GLUCOSE, POC    Collection Time     01/20/14  6:17 AM       Result Value Ref Range    Glucose (POC) 254 (*) 70 - 110 mg/dL   GLUCOSE, POC    Collection Time     01/20/14 10:58 AM       Result Value Ref Range    Glucose (POC) 217 (*) 70 - 110 mg/dL           Recent Labs      01/17/14   1923   FIO2I  0.40   HCO3I  30.4*   PCO2I  60.2*   PHI  7.312*   PO2I  184*       Special Requests:   Date Value Ref Range Status   01/18/2014 NO SPECIAL REQUESTS   Final        Culture result:   Date Value Ref Range Status   01/18/2014 MANY  NORMAL RESPIRATORY FLORA  Final   01/18/2014 FEW  CANDIDA ALBICANS     Final       Imaging:  [x] I have personally reviewed the patient???s chest radiographs images and report with the patient    Results from Hospital Encounter encounter on 01/17/14   XR CHEST PA LAT   Narrative Indication: Shortness of breath, history of COPD    Findings: Chest, AP and lateral. Cardiac silhouette and pulmonary vessels are  normal. Mild calcified plaque is present at the aortic arch. Lungs are slightly  hyperinflated. No focal consolidation.         Impression Impression:    No active disease.          Ilean Skillutul A Chondra Boyde, MD

## 2014-01-20 NOTE — Progress Notes (Signed)
Pt discharge today will be visiting with her son in [place] for a few days then returning back to [place],will schedule her own follow up appointment once she returns home,pt stated she has all her medications needed at her son's home,no further cm needs.Care Management Interventions  PCP Visit Scheduled by CM: Yes  Palliative Care Consult: No  Mode of Transport: Self  Care Management Consult: No  Discharge Durable Medical Equipment: No  Physical Therapy Consult: No  Occupational Therapy Consult: No  Speech Therapy Consult: No  Adequate Support Network: Lives with Spouse  Confirm Follow Up Transport: Self  Plan discussed with patient?: Yes  Pt Discharge Location  Living Situation: Home

## 2014-01-23 LAB — CULTURE, BLOOD
Culture result:: NO GROWTH
Culture result:: NO GROWTH

## 2016-09-08 ENCOUNTER — Observation Stay
Admission: EM | Admit: 2016-09-08 | Discharge: 2016-09-09 | Disposition: A | Discharge status: Home / Self Care | Payer: Medicare (Managed Care) | Attending: Internal Medicine | Admitting: Internal Medicine

## 2016-09-08 ENCOUNTER — Encounter: Payer: Self-pay | Admitting: Emergency Medicine

## 2016-09-08 ENCOUNTER — Emergency Department: Payer: Medicare (Managed Care)

## 2016-09-08 DIAGNOSIS — Z79899 Other long term (current) drug therapy: Secondary | ICD-10-CM | POA: Diagnosis not present

## 2016-09-08 DIAGNOSIS — E7800 Pure hypercholesterolemia, unspecified: Secondary | ICD-10-CM | POA: Insufficient documentation

## 2016-09-08 DIAGNOSIS — Z794 Long term (current) use of insulin: Secondary | ICD-10-CM | POA: Insufficient documentation

## 2016-09-08 DIAGNOSIS — I119 Hypertensive heart disease without heart failure: Secondary | ICD-10-CM | POA: Insufficient documentation

## 2016-09-08 DIAGNOSIS — Z87891 Personal history of nicotine dependence: Secondary | ICD-10-CM | POA: Insufficient documentation

## 2016-09-08 DIAGNOSIS — J9601 Acute respiratory failure with hypoxia: Secondary | ICD-10-CM | POA: Insufficient documentation

## 2016-09-08 DIAGNOSIS — E039 Hypothyroidism, unspecified: Secondary | ICD-10-CM | POA: Diagnosis not present

## 2016-09-08 DIAGNOSIS — J189 Pneumonia, unspecified organism: Secondary | ICD-10-CM | POA: Insufficient documentation

## 2016-09-08 DIAGNOSIS — E119 Type 2 diabetes mellitus without complications: Secondary | ICD-10-CM | POA: Diagnosis not present

## 2016-09-08 DIAGNOSIS — R0602 Shortness of breath: Secondary | ICD-10-CM | POA: Diagnosis present

## 2016-09-08 DIAGNOSIS — J441 Chronic obstructive pulmonary disease with (acute) exacerbation: Secondary | ICD-10-CM | POA: Diagnosis not present

## 2016-09-08 HISTORY — DX: Pure hypercholesterolemia, unspecified: E78.00

## 2016-09-08 HISTORY — DX: Type 2 diabetes mellitus without complications: E11.9

## 2016-09-08 HISTORY — DX: Chronic obstructive pulmonary disease, unspecified: J44.9

## 2016-09-08 HISTORY — DX: Unspecified asthma, uncomplicated: J45.909

## 2016-09-08 HISTORY — DX: Essential (primary) hypertension: I10

## 2016-09-08 HISTORY — DX: Hypothyroidism, unspecified: E03.9

## 2016-09-08 LAB — CBC WITH DIFFERENTIAL/PLATELET
Basophils Absolute: 0.1 10*3/uL (ref 0–0.1)
Basophils Relative: 1 %
EOS ABS: 0.1 10*3/uL (ref 0–0.7)
EOS PCT: 1 %
HCT: 42.8 % (ref 35.0–47.0)
Hemoglobin: 14.6 g/dL (ref 12.0–16.0)
LYMPHS ABS: 1 10*3/uL (ref 1.0–3.6)
Lymphocytes Relative: 12 %
MCH: 30.8 pg (ref 26.0–34.0)
MCHC: 34.1 g/dL (ref 32.0–36.0)
MCV: 90.1 fL (ref 80.0–100.0)
MONOS PCT: 7 %
Monocytes Absolute: 0.5 10*3/uL (ref 0.2–0.9)
Neutro Abs: 6.5 10*3/uL (ref 1.4–6.5)
Neutrophils Relative %: 79 %
PLATELETS: 179 10*3/uL (ref 150–440)
RBC: 4.75 MIL/uL (ref 3.80–5.20)
RDW: 12.5 % (ref 11.5–14.5)
WBC: 8.1 10*3/uL (ref 3.6–11.0)

## 2016-09-08 LAB — BASIC METABOLIC PANEL
Anion gap: 11 (ref 5–15)
BUN: 11 mg/dL (ref 6–20)
CALCIUM: 10 mg/dL (ref 8.9–10.3)
CO2: 26 mmol/L (ref 22–32)
CREATININE: 0.75 mg/dL (ref 0.44–1.00)
Chloride: 102 mmol/L (ref 101–111)
GFR calc Af Amer: >60 mL/min (ref >60)
Glucose, Bld: 250 mg/dL — ABNORMAL HIGH (ref 65–99)
Potassium: 3.9 mmol/L (ref 3.5–5.1)
SODIUM: 139 mmol/L (ref 135–145)

## 2016-09-08 LAB — BLOOD GAS, VENOUS
Acid-Base Excess: 2.2 mmol/L — ABNORMAL HIGH (ref 0.0–2.0)
Bicarbonate: 30.8 mmol/L — ABNORMAL HIGH (ref 20.0–28.0)
PATIENT TEMPERATURE: 37
pCO2, Ven: 64 mmHg — ABNORMAL HIGH (ref 44.0–60.0)
pH, Ven: 7.29 (ref 7.250–7.430)
pO2, Ven: <31 mmHg — CL (ref 32.0–45.0)

## 2016-09-08 LAB — GLUCOSE, CAPILLARY
GLUCOSE-CAPILLARY: 239 mg/dL — AB (ref 65–99)
Glucose-Capillary: 305 mg/dL — ABNORMAL HIGH (ref 65–99)

## 2016-09-08 LAB — FIBRIN DERIVATIVES D-DIMER (ARMC ONLY): FIBRIN DERIVATIVES D-DIMER (ARMC): 369 (ref 0–499)

## 2016-09-08 LAB — TROPONIN I: Troponin I: <0.03 ng/mL (ref <0.03)

## 2016-09-08 MED ORDER — INSULIN GLARGINE 100 UNIT/ML ~~LOC~~ SOLN
50.0000 [IU] | Freq: Every day | SUBCUTANEOUS | Status: DC
  Administered 2016-09-08: 22:00:00 50 [IU] via SUBCUTANEOUS
  Filled 2016-09-08 (×2): qty 0.5

## 2016-09-08 MED ORDER — METFORMIN HCL ER 500 MG PO TB24
500.0000 mg | ORAL_TABLET | Freq: Every day | ORAL | Status: DC
  Administered 2016-09-09: 500 mg via ORAL
  Filled 2016-09-08 (×2): qty 1

## 2016-09-08 MED ORDER — ONDANSETRON HCL 4 MG/2ML IJ SOLN: 4.0000 mg | Freq: Four times a day (QID) | INTRAMUSCULAR | Status: DC | PRN

## 2016-09-08 MED ORDER — LEVOFLOXACIN 750 MG PO TABS: 750.0000 mg | ORAL_TABLET | Freq: Once | ORAL | Status: DC

## 2016-09-08 MED ORDER — IPRATROPIUM-ALBUTEROL 0.5-2.5 (3) MG/3ML IN SOLN
3.0000 mL | Freq: Once | RESPIRATORY_TRACT | Status: AC
  Administered 2016-09-08: 3 mL via RESPIRATORY_TRACT
  Filled 2016-09-08: qty 3

## 2016-09-08 MED ORDER — IOPAMIDOL (ISOVUE-370) INJECTION 76%
75.0000 mL | Freq: Once | INTRAVENOUS | Status: AC | PRN
  Administered 2016-09-08: 75 mL via INTRAVENOUS

## 2016-09-08 MED ORDER — DEXTROSE 5 % IV SOLN: 1.0000 g | Freq: Once | INTRAVENOUS | Status: DC

## 2016-09-08 MED ORDER — DOXYCYCLINE HYCLATE 100 MG PO TABS
100.0000 mg | ORAL_TABLET | Freq: Once | ORAL | Status: AC
  Administered 2016-09-08: 100 mg via ORAL
  Filled 2016-09-08: qty 1

## 2016-09-08 MED ORDER — PHENYTOIN SODIUM EXTENDED 100 MG PO CAPS
300.0000 mg | ORAL_CAPSULE | Freq: Every day | ORAL | Status: DC
  Administered 2016-09-08: 22:00:00 300 mg via ORAL
  Filled 2016-09-08: qty 3

## 2016-09-08 MED ORDER — IPRATROPIUM-ALBUTEROL 0.5-2.5 (3) MG/3ML IN SOLN
3.0000 mL | RESPIRATORY_TRACT | Status: DC
  Administered 2016-09-08 – 2016-09-09 (×6): 3 mL via RESPIRATORY_TRACT
  Filled 2016-09-08 (×6): qty 3

## 2016-09-08 MED ORDER — IBUPROFEN 400 MG PO TABS: 400.0000 mg | ORAL_TABLET | Freq: Four times a day (QID) | ORAL | Status: DC | PRN

## 2016-09-08 MED ORDER — AZITHROMYCIN 500 MG PO TABS
500.0000 mg | ORAL_TABLET | Freq: Every day | ORAL | Status: DC
  Administered 2016-09-08 – 2016-09-09 (×2): 500 mg via ORAL
  Filled 2016-09-08 (×2): qty 1

## 2016-09-08 MED ORDER — PHENYTOIN SODIUM EXTENDED 100 MG PO CAPS
200.0000 mg | ORAL_CAPSULE | ORAL | Status: DC
Start: 2016-09-09 — End: 2016-09-09
  Administered 2016-09-09: 08:00:00 200 mg via ORAL
  Filled 2016-09-08: qty 2

## 2016-09-08 MED ORDER — ACETAMINOPHEN 325 MG PO TABS: 650.0000 mg | ORAL_TABLET | Freq: Four times a day (QID) | ORAL | Status: DC | PRN

## 2016-09-08 MED ORDER — ACETAMINOPHEN 650 MG RE SUPP: 650.0000 mg | Freq: Four times a day (QID) | RECTAL | Status: DC | PRN

## 2016-09-08 MED ORDER — CEFTRIAXONE SODIUM-DEXTROSE 1-3.74 GM-% IV SOLR
1.0000 g | INTRAVENOUS | Status: DC
  Filled 2016-09-08: qty 50

## 2016-09-08 MED ORDER — INSULIN ASPART 100 UNIT/ML ~~LOC~~ SOLN
0.0000 [IU] | Freq: Every day | SUBCUTANEOUS | Status: DC
  Administered 2016-09-08: 3 [IU] via SUBCUTANEOUS
  Filled 2016-09-08: qty 3

## 2016-09-08 MED ORDER — METHYLPREDNISOLONE SODIUM SUCC 125 MG IJ SOLR
125.0000 mg | Freq: Once | INTRAMUSCULAR | Status: AC
  Administered 2016-09-08: 125 mg via INTRAVENOUS
  Filled 2016-09-08: qty 2

## 2016-09-08 MED ORDER — ATORVASTATIN CALCIUM 20 MG PO TABS
20.0000 mg | ORAL_TABLET | Freq: Every day | ORAL | Status: DC
  Administered 2016-09-08: 20 mg via ORAL
  Filled 2016-09-08: qty 1

## 2016-09-08 MED ORDER — PHENYTOIN 50 MG PO CHEW
50.0000 mg | CHEWABLE_TABLET | Freq: Every day | ORAL | Status: DC
  Administered 2016-09-09: 11:00:00 50 mg via ORAL
  Filled 2016-09-08: qty 1

## 2016-09-08 MED ORDER — MOMETASONE FURO-FORMOTEROL FUM 200-5 MCG/ACT IN AERO
2.0000 | INHALATION_SPRAY | Freq: Two times a day (BID) | RESPIRATORY_TRACT | Status: DC
  Administered 2016-09-08 – 2016-09-09 (×2): 2 via RESPIRATORY_TRACT
  Filled 2016-09-08: qty 8.8

## 2016-09-08 MED ORDER — METHYLPREDNISOLONE SODIUM SUCC 125 MG IJ SOLR
60.0000 mg | Freq: Three times a day (TID) | INTRAMUSCULAR | Status: DC
  Administered 2016-09-08 – 2016-09-09 (×3): 60 mg via INTRAVENOUS
  Filled 2016-09-08 (×3): qty 2

## 2016-09-08 MED ORDER — ONDANSETRON HCL 4 MG PO TABS: 4.0000 mg | ORAL_TABLET | Freq: Four times a day (QID) | ORAL | Status: DC | PRN

## 2016-09-08 MED ORDER — INSULIN ASPART 100 UNIT/ML ~~LOC~~ SOLN
0.0000 [IU] | Freq: Three times a day (TID) | SUBCUTANEOUS | Status: DC
Start: 2016-09-08 — End: 2016-09-09
  Administered 2016-09-08: 17:00:00 7 [IU] via SUBCUTANEOUS
  Administered 2016-09-09: 3 [IU] via SUBCUTANEOUS
  Filled 2016-09-08: qty 3
  Filled 2016-09-08: qty 7

## 2016-09-08 MED ORDER — BENZONATATE 100 MG PO CAPS
200.0000 mg | ORAL_CAPSULE | Freq: Three times a day (TID) | ORAL | Status: DC
  Administered 2016-09-08 – 2016-09-09 (×2): 200 mg via ORAL
  Filled 2016-09-08 (×2): qty 2

## 2016-09-08 MED ORDER — AMLODIPINE BESYLATE 10 MG PO TABS
10.0000 mg | ORAL_TABLET | Freq: Every day | ORAL | Status: DC
  Administered 2016-09-09: 11:00:00 10 mg via ORAL
  Filled 2016-09-08: qty 1

## 2016-09-08 MED ORDER — DOCUSATE SODIUM 100 MG PO CAPS
100.0000 mg | ORAL_CAPSULE | Freq: Two times a day (BID) | ORAL | Status: DC
  Administered 2016-09-08 – 2016-09-09 (×2): 100 mg via ORAL
  Filled 2016-09-08 (×2): qty 1

## 2016-09-08 MED ORDER — POLYETHYLENE GLYCOL 3350 17 G PO PACK: 17.0000 g | PACK | Freq: Every day | ORAL | Status: DC | PRN

## 2016-09-08 MED ORDER — IRBESARTAN 150 MG PO TABS
300.0000 mg | ORAL_TABLET | Freq: Every day | ORAL | Status: DC
  Administered 2016-09-09: 300 mg via ORAL
  Filled 2016-09-08: qty 2

## 2016-09-08 MED ORDER — FENOFIBRATE 54 MG PO TABS
54.0000 mg | ORAL_TABLET | Freq: Every day | ORAL | Status: DC
  Administered 2016-09-09: 54 mg via ORAL
  Filled 2016-09-08: qty 1

## 2016-09-08 MED ORDER — LEVOTHYROXINE SODIUM 25 MCG PO TABS
175.0000 ug | ORAL_TABLET | Freq: Every day | ORAL | Status: DC
  Administered 2016-09-09: 175 ug via ORAL
  Filled 2016-09-08: qty 1

## 2016-09-08 MED ORDER — GLIPIZIDE ER 5 MG PO TB24
5.0000 mg | ORAL_TABLET | Freq: Every day | ORAL | Status: DC
  Administered 2016-09-09: 08:00:00 5 mg via ORAL
  Filled 2016-09-08: qty 1

## 2016-09-08 MED ORDER — CEFTRIAXONE SODIUM-DEXTROSE 1-3.74 GM-% IV SOLR
1.0000 g | Freq: Once | INTRAVENOUS | Status: AC
  Administered 2016-09-08: 1 g via INTRAVENOUS
  Filled 2016-09-08: qty 50

## 2016-09-08 MED ORDER — ENOXAPARIN SODIUM 40 MG/0.4ML ~~LOC~~ SOLN
40.0000 mg | SUBCUTANEOUS | Status: DC
  Administered 2016-09-08: 22:00:00 40 mg via SUBCUTANEOUS
  Filled 2016-09-08: qty 0.4

## 2016-09-08 MED ORDER — SODIUM CHLORIDE 0.9 % IV BOLUS (SEPSIS)
500.0000 mL | Freq: Once | INTRAVENOUS | Status: AC
  Administered 2016-09-08: 500 mL via INTRAVENOUS

## 2016-09-08 NOTE — ED Provider Notes (Signed)
[facility] Emergency Department Provider Note  ____________________________________________  Time seen: Approximately 10:04 AM  I have reviewed the triage vital signs and the nursing notes.   HISTORY  Chief Complaint Shortness of Breath   HPI Ria BushCarolyn S Slaby is a 82 y.o. female a history of COPD, asthma, diabetes, hypertension, hyperlipidemia who presents for evaluation of wheezing, coughing, shortness of breath. Patient is here visiting from [place] and arrived 2 days ago. She drove down for a wedding. She reports that every time she comes to [place] to visit she had a COPD exacerbation. She thinks is because of something environmental. Since yesterday she's been wheezing and has a dry cough. She is also been more short of breath. Patient is not on oxygen at baseline. Patient denies fever or chills, she has received her flu shot this season. She denies chest pain, dizziness, nausea vomiting. Patient has been using her inhalers at home initially with some improvement however today there were not helping anymore. She denies personal or family history of ischemic heart disease or blood clots, she denies leg pain or swelling, exogenous hormones, hemoptysis.  Past Medical History:  Diagnosis Date  . Asthma   . COPD (chronic obstructive pulmonary disease) (HCC)   . Diabetes mellitus without complication (HCC)   . Hypercholesteremia   . Hypertension     There are no active problems to display for this patient.   Past Surgical History:  Procedure Laterality Date  . ABDOMINAL HYSTERECTOMY    . CHOLECYSTECTOMY      Prior to Admission medications   Medication Sig Start Date End Date Taking? Authorizing Provider  amLODipine (NORVASC) 10 MG tablet Take 10 mg by mouth daily.   Yes Historical Provider, MD  atorvastatin (LIPITOR) 20 MG tablet Take 20 mg by mouth daily at 6 PM.   Yes Historical Provider, MD  fenofibrate 54 MG tablet Take 54 mg by mouth  daily.   Yes Historical Provider, MD  glipiZIDE (GLUCOTROL XL) 5 MG 24 hr tablet Take 5 mg by mouth daily with breakfast.   Yes Historical Provider, MD  insulin glargine (LANTUS) 100 unit/mL SOPN Inject 50 Units into the skin at bedtime.   Yes Historical Provider, MD  levothyroxine (SYNTHROID, LEVOTHROID) 175 MCG tablet Take 175 mcg by mouth daily before breakfast.   Yes Historical Provider, MD  metFORMIN (GLUCOPHAGE-XR) 500 MG 24 hr tablet Take 500 mg by mouth daily with breakfast.   Yes Historical Provider, MD  phenytoin (DILANTIN) 100 MG ER capsule Take 100 mg by mouth 3 (three) times daily. Takes 2 tablets in the am - 3 tablets at bedtime.   Yes Historical Provider, MD  phenytoin (DILANTIN) 50 MG tablet Chew 50 mg by mouth daily.   Yes Historical Provider, MD  valsartan (DIOVAN) 320 MG tablet Take 320 mg by mouth daily.   Yes Historical Provider, MD    Allergies Latex and Penicillins  History reviewed. No pertinent family history.  Social History Social History  Substance Use Topics  . Smoking status: Former Games developermoker  . Smokeless tobacco: Never Used  . Alcohol use No    Review of Systems  Constitutional: Negative for fever. Eyes: Negative for visual changes. ENT: Negative for sore throat. Cardiovascular: Negative for chest pain. Respiratory: + shortness of breath, wheezing, and cough Gastrointestinal: Negative for abdominal pain, vomiting or diarrhea. Genitourinary: Negative for dysuria. Musculoskeletal: Negative for back pain. Skin: Negative for rash. Neurological: Negative for headaches, weakness or numbness.  ____________________________________________  PHYSICAL EXAM:  VITAL SIGNS: ED Triage Vitals  Enc Vitals Group     BP 09/08/16 0946 (!) 185/82     Pulse Rate 09/08/16 0946 (!) 111     Resp 09/08/16 0946 (!) 31     Temp 09/08/16 0946 97.9 F (36.6 C)     Temp Source 09/08/16 0946 Oral     SpO2 09/08/16 0943 (S) (!) 85 %     Weight 09/08/16 0943 220 lb (99.8  kg)     Height 09/08/16 0943 5\' 4"  (1.626 m)     Head Circumference --      Peak Flow --      Pain Score 09/08/16 0943 0     Pain Loc --      Pain Edu? --      Excl. in GC? --     Constitutional: Alert and oriented. Well appearing and in no apparent distress. HEENT:      Head: Normocephalic and atraumatic.         Eyes: Conjunctivae are normal. Sclera is non-icteric. EOMI. PERRL      Mouth/Throat: Mucous membranes are moist.       Neck: Supple with no signs of meningismus. Cardiovascular: Tachycardic with regular rhythm. No murmurs, gallops, or rubs. 2+ symmetrical distal pulses are present in all extremities. No JVD. Respiratory: Patient is tachypneic in moderate respiratory distress, RR in the 30s, sats are in the mid 80s on room air which improved on 2 L nasal cannula, patient has decreased air movement and diffuse expiratory wheezing. Gastrointestinal: Soft, non tender, and non distended with positive bowel sounds. No rebound or guarding. Musculoskeletal: Nontender with normal range of motion in all extremities. No edema, cyanosis, or erythema of extremities. Neurologic: Normal speech and language. Face is symmetric. Moving all extremities. No gross focal neurologic deficits are appreciated. Skin: Skin is warm, dry and intact. No rash noted. Psychiatric: Mood and affect are normal. Speech and behavior are normal.  ____________________________________________   LABS (all labs ordered are listed, but only abnormal results are displayed)  Labs Reviewed  GLUCOSE, CAPILLARY - Abnormal; Notable for the following:       Result Value   Glucose-Capillary 239 (*)    All other components within normal limits  BASIC METABOLIC PANEL - Abnormal; Notable for the following:    Glucose, Bld 250 (*)    All other components within normal limits  BLOOD GAS, VENOUS - Abnormal; Notable for the following:    pCO2, Ven 64 (*)    pO2, Ven <31.0 (*)    Bicarbonate 30.8 (*)    Acid-Base Excess 2.2  (*)    All other components within normal limits  CBC WITH DIFFERENTIAL/PLATELET  FIBRIN DERIVATIVES D-DIMER ([facility] ONLY)  TROPONIN I   ____________________________________________  EKG  ED ECG REPORT I, Nita Sickle, the attending physician, personally viewed and interpreted this ECG.  Sinus tachycardia, rate of 109, normal PR and QRS intervals, prolonged QT, no ST elevations or depressions. No prior for comparison ____________________________________________  RADIOLOGY  CXR: 1. Low lung volumes with bibasilar atelectasis. Bibasilar infiltrates and or edema cannot be completely excluded. Small bilateral pleural effusions cannot be excluded.  2. Cardiomegaly.   CTA chest: 1. No evidence of a pulmonary embolus. 2. 14 mm focal area of ground-glass opacity, which could represent a small area of infection. Initial follow-up with CT at 6-12 months is recommended to confirm persistence. If persistent, repeat CT is recommended every 2 years until 5 years of stability  has been established. This recommendation follows the consensus statement: Guidelines for Management of Incidental Pulmonary Nodules Detected on CT Images: From the Fleischner Society 2017; Radiology 2017; 284:228-243. 3. No other evidence of an acute abnormality. 4. Coronary artery calcifications and aortic atherosclerosis. ____________________________________________   PROCEDURES  Procedure(s) performed: None Procedures Critical Care performed: yes  CRITICAL CARE Performed by: Nita Sicklearolina Nadalyn Deringer  ?  Total critical care time: 35 min  Critical care time was exclusive of separately billable procedures and treating other patients.  Critical care was necessary to treat or prevent imminent or life-threatening deterioration.  Critical care was time spent personally by me on the following activities: development of treatment plan with patient and/or surrogate as well as nursing, discussions with consultants,  evaluation of patient's response to treatment, examination of patient, obtaining history from patient or surrogate, ordering and performing treatments and interventions, ordering and review of laboratory studies, ordering and review of radiographic studies, pulse oximetry and re-evaluation of patient's condition.  ____________________________________________   INITIAL IMPRESSION / ASSESSMENT AND PLAN / ED COURSE  82 y.o. female a history of COPD, asthma, diabetes, hypertension, hyperlipidemia who presents for evaluation of wheezing, coughing, shortness of breath x 2 days./Patient is in moderate respiratory distress, hypoxia, tachypnea, and tachycardic. Recently drove down from [place] 2 days ago. No history of blood clots, no leg swelling or hemoptysis. We'll start patient on DuoNeb treatment, Solu-Medrol, and antibiotics for COPD exacerbation. We'll hold off on Levaquin and azithromycin due to prolonged QTC and instead will start patient on doxycycline. We'll also pursue a CTA of the chest to rule out PE. EKG with no evidence of ischemia. Will cycle troponin.  Clinical Course as of Sep 08 1308  Fri Sep 08, 2016  1204 Patient is breathing more comfortably however continues to have diffuse wheezing after 2 DuoNeb treatments. No longer requiring supplemental oxygenation. We'll order a third DuoNeb treatment at this time.  [CV]  1307 Patient continues to have significant wheezing and increased WOB. No longer hypoxic. CT showing PNA and no PE. Patient will be admitted to Hospitalist. Patient given ceftriaxone and azithromycin  [CV]    Clinical Course User Index [CV] Nita Sicklearolina Junnie Loschiavo, MD    Pertinent labs & imaging results that were available during my care of the patient were reviewed by me and considered in my medical decision making (see chart for details).    ____________________________________________   FINAL CLINICAL IMPRESSION(S) / ED DIAGNOSES  Final diagnoses:  COPD exacerbation  (HCC)  Community acquired pneumonia, unspecified laterality      NEW MEDICATIONS STARTED DURING THIS VISIT:  New Prescriptions   No medications on file     Note:  This document was prepared using Dragon voice recognition software and may include unintentional dictation errors.    Nita Sicklearolina Lissie Hinesley, MD 09/08/16 1309

## 2016-09-08 NOTE — H&P (Addendum)
[facility] -  at [facility]   PATIENT NAME: Mackenzie Hays    MR#:  [id number]  DATE OF BIRTH:  [birth year]  DATE OF ADMISSION:  09/08/2016  PRIMARY CARE PHYSICIAN: Nonlocal, from [place]  REQUESTING/REFERRING PHYSICIAN: Dr. Nita Sicklearolina Veronese  CHIEF COMPLAINT:   Chief Complaint  Patient presents with  . Shortness of Breath    HISTORY OF PRESENT ILLNESS:  Mackenzie Hays  is a 82 y.o. female with a known history of COPD not on home oxygen, diabetes mellitus, hypertension, hyperlipidemia and hypothyroidism who is coming from [place] to attend a wedding locally presents to hospital secondary to worsening shortness of breath, wheezing and cough that started yesterday. Patient states this kind of breathing episode happens whenever she cal[place] down from [place] to [place]. She is not sure if she has any environmental allergies. Yesterday she started to have some trouble breathing but the wheezing did not start until last night. She continued to use her nebulizers all through the night without much relief. She has been having coughing spells and also chest pressure so presents to the emergency room. She was given IV steroids and nebulizer with minimal relief. So she will be admitted for COPD exacerbation and CT of the chest showing early infiltrate. However due to the wedding tomorrow, patient would like to be discharged if possible. She was hypoxic on presentation with saturations of 85% on room air, however they have improved after nebulizer and steroid treatments  PAST MEDICAL HISTORY:   Past Medical History:  Diagnosis Date  . Asthma   . COPD (chronic obstructive pulmonary disease) (HCC)    Not on home oxygen  . Diabetes mellitus without complication (HCC)   . Hypercholesteremia   . Hypertension   . Hypothyroidism     PAST SURGICAL HISTORY:   Past Surgical History:  Procedure Laterality Date  . ABDOMINAL HYSTERECTOMY    .  CHOLECYSTECTOMY      SOCIAL HISTORY:   Social History  Substance Use Topics  . Smoking status: Former Games developermoker  . Smokeless tobacco: Never Used     Comment: Quit about 40 years ago  . Alcohol use No     Comment: very Occasional    FAMILY HISTORY:   Family History  Problem Relation Age of Onset  . CAD Mother   . CAD Father   . Heart failure Father     DRUG ALLERGIES:   Allergies  Allergen Reactions  . Latex Rash  . Penicillins Rash    Has patient had a PCN reaction causing immediate rash, facial/tongue/throat swelling, SOB or lightheadedness with hypotension: Yes Has patient had a PCN reaction causing severe rash involving mucus membranes or skin necrosis: No Has patient had a PCN reaction that required hospitalization No Has patient had a PCN reaction occurring within the last 10 years: No If all of the above answers are "NO", then may proceed with Cephalosporin use.     REVIEW OF SYSTEMS:   Review of Systems  Constitutional: Positive for malaise/fatigue. Negative for chills, fever and weight loss.  HENT: Negative for ear discharge, ear pain, hearing loss and nosebleeds.   Eyes: Negative for blurred vision, double vision and photophobia.  Respiratory: Positive for cough, shortness of breath and wheezing. Negative for hemoptysis.   Cardiovascular: Positive for chest pain. Negative for palpitations, orthopnea and leg swelling.  Gastrointestinal: Negative for abdominal pain, constipation, diarrhea, heartburn, melena, nausea and vomiting.  Genitourinary: Negative for dysuria, frequency and  urgency.  Musculoskeletal: Negative for back pain, myalgias and neck pain.  Skin: Negative for rash.  Neurological: Negative for dizziness, tremors, sensory change, speech change, focal weakness and headaches.  Endo/Heme/Allergies: Does not bruise/bleed easily.  Psychiatric/Behavioral: Negative for depression.    MEDICATIONS AT HOME:   Prior to Admission medications   Medication  Sig Start Date End Date Taking? Authorizing Provider  amLODipine (NORVASC) 10 MG tablet Take 10 mg by mouth daily.   Yes Historical Provider, MD  atorvastatin (LIPITOR) 20 MG tablet Take 20 mg by mouth daily at 6 PM.   Yes Historical Provider, MD  fenofibrate 54 MG tablet Take 54 mg by mouth daily.   Yes Historical Provider, MD  glipiZIDE (GLUCOTROL XL) 5 MG 24 hr tablet Take 5 mg by mouth daily with breakfast.   Yes Historical Provider, MD  insulin glargine (LANTUS) 100 unit/mL SOPN Inject 50 Units into the skin at bedtime.   Yes Historical Provider, MD  levothyroxine (SYNTHROID, LEVOTHROID) 175 MCG tablet Take 175 mcg by mouth daily before breakfast.   Yes Historical Provider, MD  metFORMIN (GLUCOPHAGE-XR) 500 MG 24 hr tablet Take 500 mg by mouth daily with breakfast.   Yes Historical Provider, MD  phenytoin (DILANTIN) 100 MG ER capsule Take 100 mg by mouth 3 (three) times daily. Takes 2 tablets in the am - 3 tablets at bedtime.   Yes Historical Provider, MD  phenytoin (DILANTIN) 50 MG tablet Chew 50 mg by mouth daily.   Yes Historical Provider, MD  valsartan (DIOVAN) 320 MG tablet Take 320 mg by mouth daily.   Yes Historical Provider, MD      VITAL SIGNS:  Blood pressure (!) 159/60, pulse 98, temperature 97.9 F (36.6 C), temperature source Oral, resp. rate 18, height 5\' 4"  (1.626 m), weight 99.8 kg (220 lb), SpO2 100 %.  PHYSICAL EXAMINATION:   Physical Exam  GENERAL:  82 y.o.-year-old patient Sitting in the bed with no acute distress.  EYES: Pupils equal, round, reactive to light and accommodation. No scleral icterus. Extraocular muscles intact.  HEENT: Head atraumatic, normocephalic. Oropharynx and nasopharynx clear.  NECK:  Supple, no jugular venous distention. No thyroid enlargement, no tenderness.  LUNGS: Diffuse scattered expiratory wheezing all over lung fields, no rales,rhonchi or crepitation. No use of accessory muscles of respiration.  CARDIOVASCULAR: S1, S2 normal. No  murmurs, rubs, or gallops.  ABDOMEN: Soft, nontender, nondistended. Bowel sounds present. No organomegaly or mass.  EXTREMITIES: No pedal edema, cyanosis, or clubbing.  NEUROLOGIC: Cranial nerves II through XII are intact. Muscle strength 5/5 in all extremities. Sensation intact. Gait not checked.  PSYCHIATRIC: The patient is alert and oriented x 3.  SKIN: No obvious rash, lesion, or ulcer.   LABORATORY PANEL:   CBC  Recent Labs Lab 09/08/16 0955  WBC 8.1  HGB 14.6  HCT 42.8  PLT 179   ------------------------------------------------------------------------------------------------------------------  Chemistries   Recent Labs Lab 09/08/16 0955  NA 139  K 3.9  CL 102  CO2 26  GLUCOSE 250*  BUN 11  CREATININE 0.75  CALCIUM 10.0   ------------------------------------------------------------------------------------------------------------------  Cardiac Enzymes  Recent Labs Lab 09/08/16 0955  TROPONINI <0.03   ------------------------------------------------------------------------------------------------------------------  RADIOLOGY:  Ct Angio Chest Pe W And/or Wo Contrast  Result Date: 09/08/2016 CLINICAL DATA:  Pt reports hx of copd, traveling from [place]; started to have SOB last night; denies pain. EXAM: CT ANGIOGRAPHY CHEST WITH CONTRAST TECHNIQUE: Multidetector CT imaging of the chest was performed using the standard protocol during bolus administration of  intravenous contrast. Multiplanar CT image reconstructions and MIPs were obtained to evaluate the vascular anatomy. CONTRAST:  75 mL of Isovue 370 intravenous contrast COMPARISON:  Current chest radiographs FINDINGS: Cardiovascular: Satisfactory opacification of the pulmonary arteries to the segmental level. No evidence of pulmonary embolism. Heart is normal in size. There are moderate coronary artery calcifications. Aorta is normal in caliber. No dissection. Atherosclerotic calcifications are noted along the  thoracic aorta and at the origins of the branch vessels. Mediastinum/Nodes: No mediastinal or hilar masses or enlarged lymph nodes. Trachea and esophagus are unremarkable. Lungs/Pleura: Focal area of ground-glass opacity in the medial right upper lobe, centered on image 36, series 6, measuring 14 mm. 5 mm, discoid area of opacity adjacent to the minor fissure in the superior right middle lobe, likely an area of atelectasis or a subpleural lymph node. No other lung nodules or areas of consolidation. There is no pulmonary edema. No pleural effusion. No pneumothorax. Upper Abdomen: No acute finding.  Status post cholecystectomy. Musculoskeletal: No fracture. No osteoblastic or osteolytic lesions. Bones are demineralized. Degenerative changes noted along the visualized spine. Review of the MIP images confirms the above findings. IMPRESSION: 1. No evidence of a pulmonary embolus. 2. 14 mm focal area of ground-glass opacity, which could represent a small area of infection. Initial follow-up with CT at 6-12 months is recommended to confirm persistence. If persistent, repeat CT is recommended every 2 years until 5 years of stability has been established. This recommendation follows the consensus statement: Guidelines for Management of Incidental Pulmonary Nodules Detected on CT Images: From the Fleischner Society 2017; Radiology 2017; 284:228-243. 3. No other evidence of an acute abnormality. 4. Coronary artery calcifications and aortic atherosclerosis. Electronically Signed   By: Amie Portland M.D.   On: 09/08/2016 12:12   Dg Chest Portable 1 View  Result Date: 09/08/2016 CLINICAL DATA:  Shortness of breath. EXAM: PORTABLE CHEST 1 VIEW COMPARISON:  No prior. FINDINGS: Cardiomegaly with normal pulmonary vascularity. Low lung volumes with basilar atelectasis. Bibasilar infiltrates/edema cannot be excluded. Small bilateral pleural effusions cannot be excluded . IMPRESSION: 1. Low lung volumes with bibasilar atelectasis.  Bibasilar infiltrates and or edema cannot be completely excluded. Small bilateral pleural effusions cannot be excluded. 2. Cardiomegaly. Electronically Signed   By: Maisie Fus  Register   On: 09/08/2016 10:19    EKG:   Orders placed or performed during the hospital encounter of 09/08/16  . EKG 12-Lead  . EKG 12-Lead    IMPRESSION AND PLAN:   Zykerria Tanton  is a 82 y.o. female with a known history of COPD not on home oxygen, diabetes mellitus, hypertension, hyperlipidemia and hypothyroidism who is coming from [place] to attend a wedding locally presents to hospital secondary to worsening shortness of breath, wheezing and cough that started yesterday.   #1 acute hypoxic respiratory failure-secondary to COPD exacerbation and right upper lobe pneumonia -Blood cultures ordered -Please supplement with oxygen as needed. Sats have improved at this time. -Admit for IV steroids, nebulizer treatments. Also started on Rocephin and azithromycin -Cough medications. -Patient would like to be discharged tomorrow if possible  #2 hypertension-on Norvasc and Diovan-changed to avapro in the hospital  #3 diabetes mellitus-check A1c. Restart her metformin, glipizide and Lantus. Added sliding scale insulin.  #4 hypothyroidism-continue Synthroid  #5 DVT prophylaxis-on Lovenox   All the records are reviewed and case discussed with ED provider. Management plans discussed with the patient, family and they are in agreement.  CODE STATUS: Full code  TOTAL TIME  TAKING CARE OF THIS PATIENT: 50 minutes.    Hayes Rehfeldt M.D on 09/08/2016 at 1:55 PM  Between 7am to 6pm - Pager - [phone]  After 6pm go to [email or web address] - password [email or web address]  [facility]  Office  [phone]  CC: Primary care physician; Pcp Not In System

## 2016-09-08 NOTE — Progress Notes (Signed)
Pharmacy Antibiotic Note  Mackenzie Hays is a 82 y.o. female admitted on 09/08/2016 with  pneumonia.  Pharmacy has been consulted for Ceftriaxone dosing. Patient received ceftriaxone 1gm X1 in ED.   Plan: Will start the patient on ceftriaxone 1gm IV every 24 hours  Height: 5\' 4"  (162.6 cm) Weight: 220 lb (99.8 kg) IBW/kg (Calculated) : 54.7  Temp (24hrs), Avg:97.9 F (36.6 C), Min:97.8 F (36.6 C), Max:97.9 F (36.6 C)   Recent Labs Lab 09/08/16 0955  WBC 8.1  CREATININE 0.75    Estimated Creatinine Clearance: 64.4 mL/min (by C-G formula based on SCr of 0.75 mg/dL).    Allergies  Allergen Reactions  . Latex Rash  . Penicillins Rash    Has patient had a PCN reaction causing immediate rash, facial/tongue/throat swelling, SOB or lightheadedness with hypotension: Yes Has patient had a PCN reaction causing severe rash involving mucus membranes or skin necrosis: No Has patient had a PCN reaction that required hospitalization No Has patient had a PCN reaction occurring within the last 10 years: No If all of the above answers are "NO", then may proceed with Cephalosporin use.     Antimicrobials this admission: 11/10 Ceftriaxone >>  11/10 azithromycin >>   Dose adjustments this admission:   Microbiology results:  Thank you for allowing pharmacy to be a part of this patient's care.  Gardner CandleSheema M Latrish Mogel, PharmD Clinical Pharmacist  09/08/2016 3:22 PM

## 2016-09-08 NOTE — ED Triage Notes (Signed)
Pt reports hx of copd, traveling from [place]; started to have SOB last night; denies pain

## 2016-09-08 NOTE — ED Notes (Signed)
Pt ambulated to BR, sats dropped to 88% and WOB increased

## 2016-09-08 NOTE — Care Management Obs Status (Signed)
MEDICARE OBSERVATION STATUS NOTIFICATION   Patient Details  Name: Mackenzie BushCarolyn S Hildebrandt MRN: [id number] Date of Birth: [birth year]   Medicare Observation Status Notification Given:  Yes    Berna BueCheryl Devyon Keator, RN 09/08/2016, 1:54 PM

## 2016-09-09 LAB — HEMOGLOBIN A1C
HEMOGLOBIN A1C: 8 % — AB (ref 4.8–5.6)
MEAN PLASMA GLUCOSE: 183 mg/dL

## 2016-09-09 LAB — GLUCOSE, CAPILLARY: GLUCOSE-CAPILLARY: 203 mg/dL — AB (ref 65–99)

## 2016-09-09 MED ORDER — DOCUSATE SODIUM 100 MG PO CAPS: 100.0000 mg | ORAL_CAPSULE | Freq: Two times a day (BID) | ORAL | 0 refills | Status: AC | PRN

## 2016-09-09 MED ORDER — PREDNISONE 10 MG (21) PO TBPK: 10.0000 mg | ORAL_TABLET | Freq: Every day | ORAL | 0 refills | Status: AC

## 2016-09-09 MED ORDER — ALBUTEROL SULFATE (2.5 MG/3ML) 0.083% IN NEBU: 2.5000 mg | INHALATION_SOLUTION | RESPIRATORY_TRACT | 2 refills | Status: AC | PRN

## 2016-09-09 MED ORDER — AZITHROMYCIN 500 MG PO TABS: 500.0000 mg | ORAL_TABLET | Freq: Every day | ORAL | 0 refills | Status: AC

## 2016-09-09 MED ORDER — BENZONATATE 200 MG PO CAPS: 200.0000 mg | ORAL_CAPSULE | Freq: Three times a day (TID) | ORAL | 0 refills | Status: AC | PRN

## 2016-09-09 MED ORDER — ACETAMINOPHEN 325 MG PO TABS: 650.0000 mg | ORAL_TABLET | Freq: Four times a day (QID) | ORAL | Status: AC | PRN

## 2016-09-09 MED ORDER — IPRATROPIUM-ALBUTEROL 0.5-2.5 (3) MG/3ML IN SOLN: 3.0000 mL | Freq: Four times a day (QID) | RESPIRATORY_TRACT | 1 refills | Status: AC | PRN

## 2016-09-09 MED ORDER — FLUTICASONE-SALMETEROL 115-21 MCG/ACT IN AERO: 2.0000 | INHALATION_SPRAY | Freq: Two times a day (BID) | RESPIRATORY_TRACT | 12 refills | Status: AC

## 2016-09-09 MED ORDER — ALBUTEROL SULFATE HFA 108 (90 BASE) MCG/ACT IN AERS: 2.0000 | INHALATION_SPRAY | Freq: Four times a day (QID) | RESPIRATORY_TRACT | 2 refills | Status: AC | PRN

## 2016-09-09 NOTE — Discharge Summary (Signed)
[facility] [place] at [facility]   PATIENT NAME: Mackenzie Hays    MR#:  [id number]  DATE OF BIRTH:  10/23/1934  DATE OF ADMISSION:  09/08/2016 ADMITTING PHYSICIAN: Enid Baas, MD  DATE OF DISCHARGE:  09/09/2016 PRIMARY CARE PHYSICIAN: Pcp Not In System    ADMISSION DIAGNOSIS:  COPD exacerbation (HCC) [J44.1] Community acquired pneumonia, unspecified laterality [J18.9]  DISCHARGE DIAGNOSIS:  Active Problems:   COPD exacerbation (HCC) Community acquired pneumonia Possible pulmonary nodule  SECONDARY DIAGNOSIS:   Past Medical History:  Diagnosis Date  . Asthma   . COPD (chronic obstructive pulmonary disease) (HCC)    Not on home oxygen  . Diabetes mellitus without complication (HCC)   . Hypercholesteremia   . Hypertension   . Hypothyroidism     HOSPITAL COURSE:   Mackenzie Hays  is a 82 y.o. female with a known history of COPD not on home oxygen, diabetes mellitus, hypertension, hyperlipidemia and hypothyroidism who is coming from [place] to attend a wedding locally presents to hospital secondary to worsening shortness of breath, wheezing and cough that started yesterday.   #1 acute hypoxic respiratory failure-secondary to COPD exacerbation and right upper lobe pneumonia -Clinically improved with IV Solu-Medrol and breathing treatments, patient wants to get discharged as she has a wedding today afternoon. Ambulatory pulse ox on room air is 97% -Blood cultures are pending-PCP to follow up -Tapered IV to by mouth steroids and continue nebulizer treatments. Patient has nebulizer machine at home. -on Rocephin and azithromycin during the hospital course will discharge patient home with Augmentin -Cough medications when necessary   # Pulmonary nodule versus infection on CT scan-repeat CT scan in 6 months is advised  # hypertension-on Norvasc and Diovan-changed to avapro in the hospital  # diabetes mellitus-A1c 8.0.  Restart her metformin, glipizide and Lantus. . As the patient is traveling at this time hesitant to change or adjust her medications, PCP can titrate once patient gets back to [place] Provided sliding-scale insulin during the hospital course   # hypothyroidism-continue Synthroid  # DVT prophylaxis-on Lovenox during the hospital course. Patient is ambulatory   DISCHARGE CONDITIONS:   Fair  CONSULTS OBTAINED:   none  PROCEDURES None  DRUG ALLERGIES:   Allergies  Allergen Reactions  . Latex Rash  . Penicillins Rash    Has patient had a PCN reaction causing immediate rash, facial/tongue/throat swelling, SOB or lightheadedness with hypotension: Yes Has patient had a PCN reaction causing severe rash involving mucus membranes or skin necrosis: No Has patient had a PCN reaction that required hospitalization No Has patient had a PCN reaction occurring within the last 10 years: No If all of the above answers are "NO", then may proceed with Cephalosporin use.     DISCHARGE MEDICATIONS:   Current Discharge Medication List    START taking these medications   Details  acetaminophen (TYLENOL) 325 MG tablet Take 2 tablets (650 mg total) by mouth every 6 (six) hours as needed for mild pain (or Fever >/= 101).    albuterol (PROVENTIL HFA;VENTOLIN HFA) 108 (90 Base) MCG/ACT inhaler Inhale 2 puffs into the lungs every 6 (six) hours as needed for wheezing or shortness of breath. Qty: 1 Inhaler, Refills: 2    albuterol (PROVENTIL) (2.5 MG/3ML) 0.083% nebulizer solution Take 3 mLs (2.5 mg total) by nebulization every 4 (four) hours as needed for wheezing or shortness of breath. Qty: 75 mL, Refills: 2    azithromycin (ZITHROMAX) 500 MG tablet Take  1 tablet (500 mg total) by mouth daily. Qty: 3 tablet, Refills: 0    benzonatate (TESSALON) 200 MG capsule Take 1 capsule (200 mg total) by mouth 3 (three) times daily as needed for cough. Qty: 20 capsule, Refills: 0    docusate sodium  (COLACE) 100 MG capsule Take 1 capsule (100 mg total) by mouth 2 (two) times daily as needed for mild constipation. Qty: 10 capsule, Refills: 0    fluticasone-salmeterol (ADVAIR HFA) 115-21 MCG/ACT inhaler Inhale 2 puffs into the lungs 2 (two) times daily. Qty: 1 Inhaler, Refills: 12    ipratropium-albuterol (DUONEB) 0.5-2.5 (3) MG/3ML SOLN Take 3 mLs by nebulization every 6 (six) hours as needed. Qty: 360 mL, Refills: 1    predniSONE (STERAPRED UNI-PAK 21 TAB) 10 MG (21) TBPK tablet Take 1 tablet (10 mg total) by mouth daily. Take 6 tablets by mouth for 1 day followed by  5 tablets by mouth for 1 day followed by  4 tablets by mouth for 1 day followed by  3 tablets by mouth for 1 day followed by  2 tablets by mouth for 1 day followed by  1 tablet by mouth for a day and stop Qty: 21 tablet, Refills: 0      CONTINUE these medications which have NOT CHANGED   Details  amLODipine (NORVASC) 10 MG tablet Take 10 mg by mouth daily.    atorvastatin (LIPITOR) 20 MG tablet Take 20 mg by mouth daily at 6 PM.    fenofibrate 54 MG tablet Take 54 mg by mouth daily.    glipiZIDE (GLUCOTROL XL) 5 MG 24 hr tablet Take 5 mg by mouth daily with breakfast.    insulin glargine (LANTUS) 100 unit/mL SOPN Inject 50 Units into the skin at bedtime.    levothyroxine (SYNTHROID, LEVOTHROID) 175 MCG tablet Take 175 mcg by mouth daily before breakfast.    metFORMIN (GLUCOPHAGE-XR) 500 MG 24 hr tablet Take 500 mg by mouth daily with breakfast.    phenytoin (DILANTIN) 100 MG ER capsule Take 100 mg by mouth 3 (three) times daily. Takes 2 tablets in the am - 3 tablets at bedtime.    phenytoin (DILANTIN) 50 MG tablet Chew 50 mg by mouth daily.    valsartan (DIOVAN) 320 MG tablet Take 320 mg by mouth daily.         DISCHARGE INSTRUCTIONS:   Activity as tolerated Diet diabetic and low-salt Follow-up with primary care physician in a week, PCP to follow up on the pending blood cultures  DIET:  Diabetic  diet, low salt  DISCHARGE CONDITION:  Stable  ACTIVITY:  Activity as tolerated  OXYGEN:  Home Oxygen: No.   Oxygen Delivery: room air  DISCHARGE LOCATION:  home   If you experience worsening of your admission symptoms, develop shortness of breath, life threatening emergency, suicidal or homicidal thoughts you must seek medical attention immediately by calling 911 or calling your MD immediately  if symptoms less severe.  You Must read complete instructions/literature along with all the possible adverse reactions/side effects for all the Medicines you take and that have been prescribed to you. Take any new Medicines after you have completely understood and accpet all the possible adverse reactions/side effects.   Please note  You were cared for by a hospitalist during your hospital stay. If you have any questions about your discharge medications or the care you received while you were in the hospital after you are discharged, you can call the unit and asked to speak  with the hospitalist on call if the hospitalist that took care of you is not available. Once you are discharged, your primary care physician will handle any further medical issues. Please note that NO REFILLS for any discharge medications will be authorized once you are discharged, as it is imperative that you return to your primary care physician (or establish a relationship with a primary care physician if you do not have one) for your aftercare needs so that they can reassess your need for medications and monitor your lab values.     Today  Chief Complaint  Patient presents with  . Shortness of Breath   Patient is doing fine. Not wheezing or shortness of breath. Really want to be discharged as she has a wedding today in the afternoon. Patient is from [place] state. Ambulated in the hallway on room air with pulse ox 97%  ROS:  CONSTITUTIONAL: Denies fevers, chills. Denies any fatigue, weakness.  EYES: Denies blurry  vision, double vision, eye pain. EARS, NOSE, THROAT: Denies tinnitus, ear pain, hearing loss. RESPIRATORY: Denies cough, wheeze, shortness of breath.  CARDIOVASCULAR: Denies chest pain, palpitations, edema.  GASTROINTESTINAL: Denies nausea, vomiting, diarrhea, abdominal pain. Denies bright red blood per rectum. GENITOURINARY: Denies dysuria, hematuria. ENDOCRINE: Denies nocturia or thyroid problems. HEMATOLOGIC AND LYMPHATIC: Denies easy bruising or bleeding. SKIN: Denies rash or lesion. MUSCULOSKELETAL: Denies pain in neck, back, shoulder, knees, hips or arthritic symptoms.  NEUROLOGIC: Denies paralysis, paresthesias.  PSYCHIATRIC: Denies anxiety or depressive symptoms.   VITAL SIGNS:  Blood pressure 138/65, pulse 94, temperature 97.7 F (36.5 C), temperature source Oral, resp. rate 20, height 5\' 4"  (1.626 m), weight 99.8 kg (220 lb), SpO2 96 %.  I/O:    Intake/Output Summary (Last 24 hours) at 09/09/16 1140 Last data filed at 09/09/16 0900  Gross per 24 hour  Intake              740 ml  Output                0 ml  Net              740 ml    PHYSICAL EXAMINATION:  GENERAL:  82 y.o.-year-old patient lying in the bed with no acute distress.  EYES: Pupils equal, round, reactive to light and accommodation. No scleral icterus. Extraocular muscles intact.  HEENT: Head atraumatic, normocephalic. Oropharynx and nasopharynx clear.  NECK:  Supple, no jugular venous distention. No thyroid enlargement, no tenderness.  LUNGS: Normal breath sounds bilaterally, no wheezing, rales,rhonchi or crepitation. No use of accessory muscles of respiration.  CARDIOVASCULAR: S1, S2 normal. No murmurs, rubs, or gallops.  ABDOMEN: Soft, non-tender, non-distended. Bowel sounds present. No organomegaly or mass.  EXTREMITIES: No pedal edema, cyanosis, or clubbing.  NEUROLOGIC: Cranial nerves II through XII are intact. Muscle strength 5/5 in all extremities. Sensation intact. Gait not checked.  PSYCHIATRIC:  The patient is alert and oriented x 3.  SKIN: No obvious rash, lesion, or ulcer.   DATA REVIEW:   CBC  Recent Labs Lab 09/08/16 0955  WBC 8.1  HGB 14.6  HCT 42.8  PLT 179    Chemistries   Recent Labs Lab 09/08/16 0955  NA 139  K 3.9  CL 102  CO2 26  GLUCOSE 250*  BUN 11  CREATININE 0.75  CALCIUM 10.0    Cardiac Enzymes  Recent Labs Lab 09/08/16 0955  TROPONINI <0.03    Microbiology Results  No results found for this or any previous visit.  RADIOLOGY:  Ct Angio Chest Pe W And/or Wo Contrast  Result Date: 09/08/2016 CLINICAL DATA:  Pt reports hx of copd, traveling from [place]; started to have SOB last night; denies pain. EXAM: CT ANGIOGRAPHY CHEST WITH CONTRAST TECHNIQUE: Multidetector CT imaging of the chest was performed using the standard protocol during bolus administration of intravenous contrast. Multiplanar CT image reconstructions and MIPs were obtained to evaluate the vascular anatomy. CONTRAST:  75 mL of Isovue 370 intravenous contrast COMPARISON:  Current chest radiographs FINDINGS: Cardiovascular: Satisfactory opacification of the pulmonary arteries to the segmental level. No evidence of pulmonary embolism. Heart is normal in size. There are moderate coronary artery calcifications. Aorta is normal in caliber. No dissection. Atherosclerotic calcifications are noted along the thoracic aorta and at the origins of the branch vessels. Mediastinum/Nodes: No mediastinal or hilar masses or enlarged lymph nodes. Trachea and esophagus are unremarkable. Lungs/Pleura: Focal area of ground-glass opacity in the medial right upper lobe, centered on image 36, series 6, measuring 14 mm. 5 mm, discoid area of opacity adjacent to the minor fissure in the superior right middle lobe, likely an area of atelectasis or a subpleural lymph node. No other lung nodules or areas of consolidation. There is no pulmonary edema. No pleural effusion. No pneumothorax. Upper Abdomen: No acute  finding.  Status post cholecystectomy. Musculoskeletal: No fracture. No osteoblastic or osteolytic lesions. Bones are demineralized. Degenerative changes noted along the visualized spine. Review of the MIP images confirms the above findings. IMPRESSION: 1. No evidence of a pulmonary embolus. 2. 14 mm focal area of ground-glass opacity, which could represent a small area of infection. Initial follow-up with CT at 6-12 months is recommended to confirm persistence. If persistent, repeat CT is recommended every 2 years until 5 years of stability has been established. This recommendation follows the consensus statement: Guidelines for Management of Incidental Pulmonary Nodules Detected on CT Images: From the Fleischner Society 2017; Radiology 2017; 284:228-243. 3. No other evidence of an acute abnormality. 4. Coronary artery calcifications and aortic atherosclerosis. Electronically Signed   By: Amie Portlandavid  Ormond M.D.   On: 09/08/2016 12:12   Dg Chest Portable 1 View  Result Date: 09/08/2016 CLINICAL DATA:  Shortness of breath. EXAM: PORTABLE CHEST 1 VIEW COMPARISON:  No prior. FINDINGS: Cardiomegaly with normal pulmonary vascularity. Low lung volumes with basilar atelectasis. Bibasilar infiltrates/edema cannot be excluded. Small bilateral pleural effusions cannot be excluded . IMPRESSION: 1. Low lung volumes with bibasilar atelectasis. Bibasilar infiltrates and or edema cannot be completely excluded. Small bilateral pleural effusions cannot be excluded. 2. Cardiomegaly. Electronically Signed   By: Maisie Fushomas  Register   On: 09/08/2016 10:19    EKG:   Orders placed or performed during the hospital encounter of 09/08/16  . EKG 12-Lead  . EKG 12-Lead      Management plans discussed with the patient, she is  in agreement. RN Maddy was present during the discussion  CODE STATUS:     Code Status Orders        Start     Ordered   09/08/16 1519  Full code  Continuous     09/08/16 1518    Code Status History     Date Active Date Inactive Code Status Order ID Comments User Context   This patient has a current code status but no historical code status.    Advance Directive Documentation   Flowsheet Row Most Recent Value  Type of Advance Directive  Living will, Healthcare Power of Attorney  Pre-existing out of  facility DNR order (yellow form or pink MOST form)  No data  "MOST" Form in Place?  No data      TOTAL TIME TAKING CARE OF THIS PATIENT: 45  minutes.   Note: This dictation was prepared with Dragon dictation along with smaller phrase technology. Any transcriptional errors that result from this process are unintentional.   @[facility] @  on 09/09/2016 at 11:40 AM  Between 7am to 6pm - Pager - [phone]  After 6pm go to [email or web address] - password [email or web address]  [facility]  Office  [phone]  CC: Primary care physician; Pcp Not In System

## 2016-09-09 NOTE — Discharge Instructions (Signed)
Activity as tolerated Diet diabetic and low-salt Follow-up with primary care physician in a week, PCP to follow up on the pending blood cultures

## 2016-09-09 NOTE — Progress Notes (Signed)
Discharge instructions along with home medications and follow up gone over with patient. Pt verbalizes that she understood instructions. Printed prescriptions given to patient. IV removed. Pt being discharged home on room air, no distress noted. Otilio JeffersonMadelyn S Fenton, RN

## 2016-09-11 LAB — GLUCOSE, CAPILLARY: Glucose-Capillary: 269 mg/dL — ABNORMAL HIGH (ref 65–99)

## 2016-09-13 LAB — CULTURE, BLOOD (ROUTINE X 2)
CULTURE: NO GROWTH
Culture: NO GROWTH

## 2017-11-01 IMAGING — CT CT ANGIO CHEST
2 of 6 series · 18 of 46 positions shown · IV contrast (APPLIED)
Comparison: Current chest radiographs

CLINICAL DATA: Pt reports hx of copd, traveling from [place]; started to
have SOB last night; denies pain.

EXAM:
CT ANGIOGRAPHY CHEST WITH CONTRAST
TECHNIQUE: Multidetector CT imaging of the chest was performed using the
standard protocol during bolus administration of intravenous
contrast. Multiplanar CT image reconstructions and MIPs were
obtained to evaluate the vascular anatomy.
CONTRAST:  75 mL of Isovue 370 intravenous contrast

[Series 5: thins · axial · 0.66mm/px · z∈[-352,-86]mm · 16 of 292 slices shown]
[im 13/292  lung]
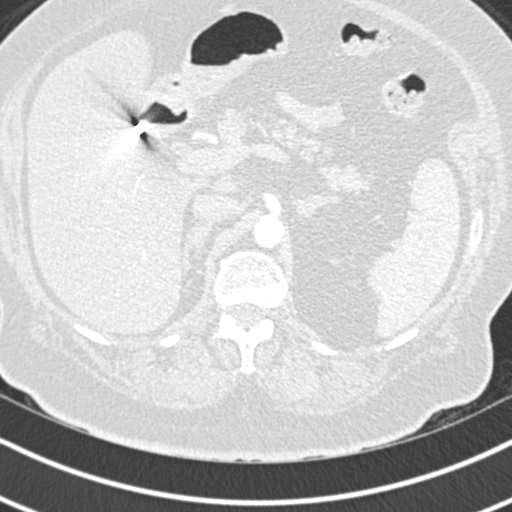
[im 38/292  soft-tissue]
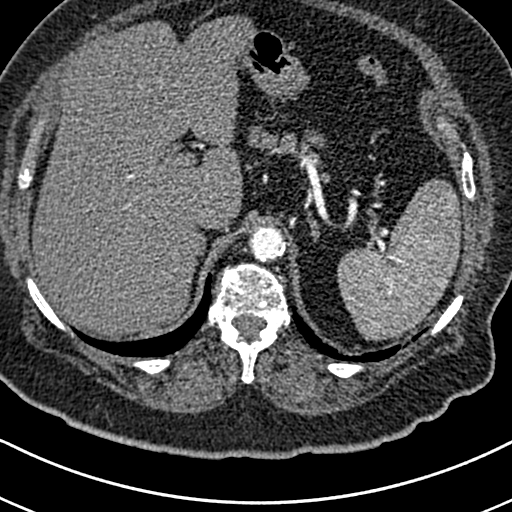
[im 51/292  lung]
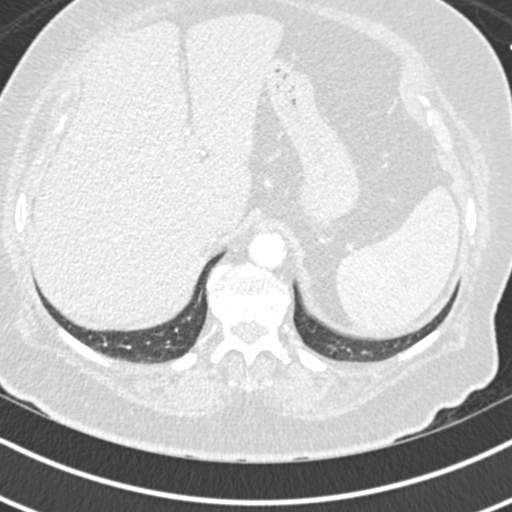
[im 64/292  soft-tissue]
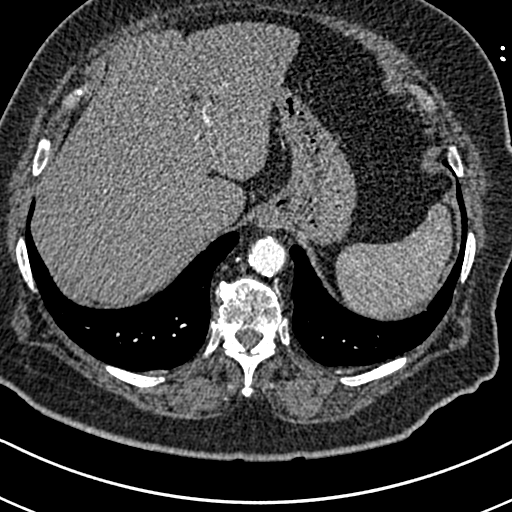
[im 89/292  lung]
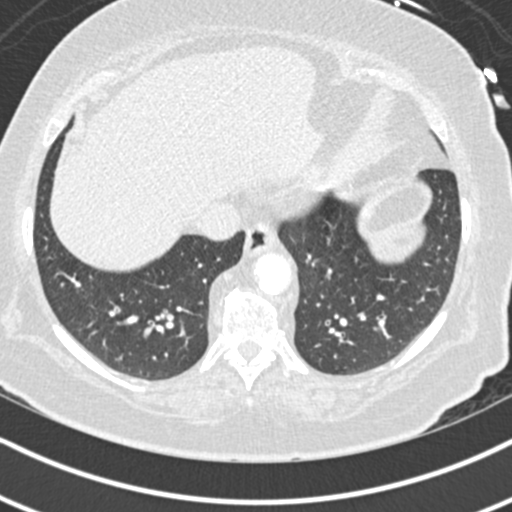
[im 102/292  soft-tissue]
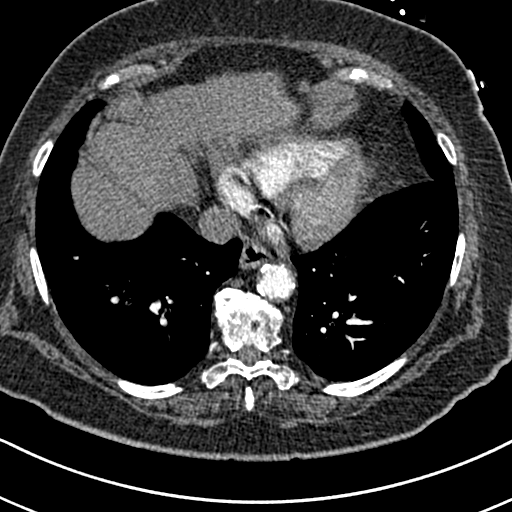
[im 114/292  lung]
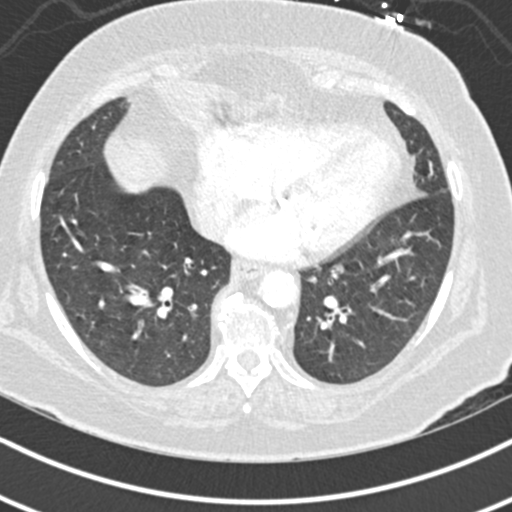
[im 140/292  soft-tissue]
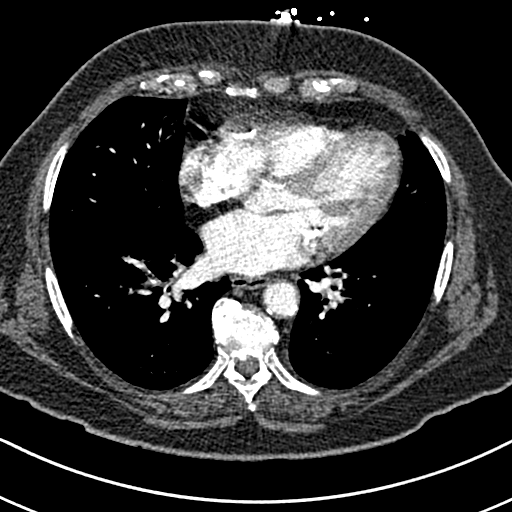
[im 152/292  lung]
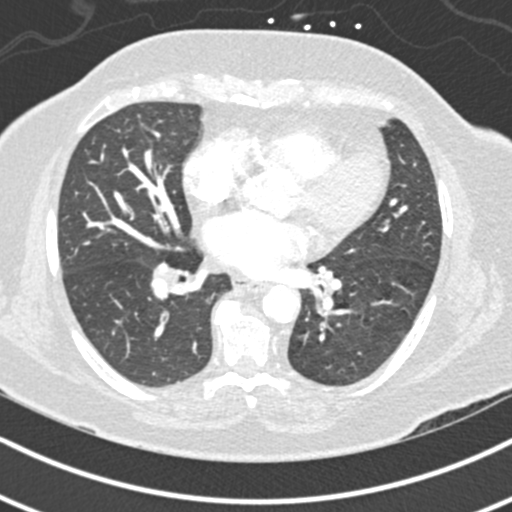
[im 178/292  soft-tissue]
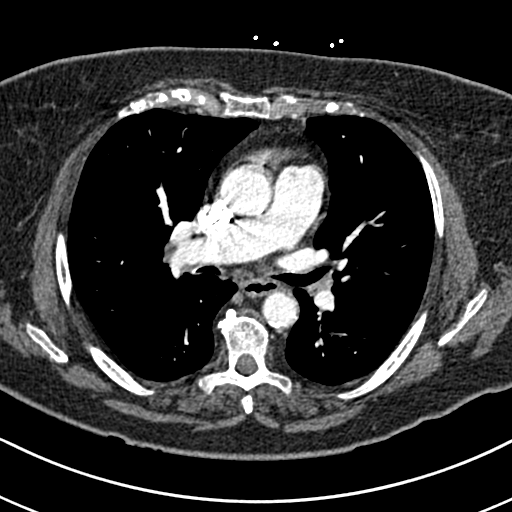
[im 190/292  lung]
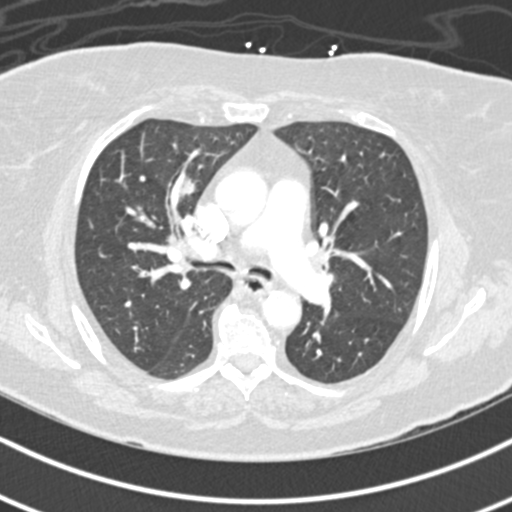
[im 203/292  soft-tissue]
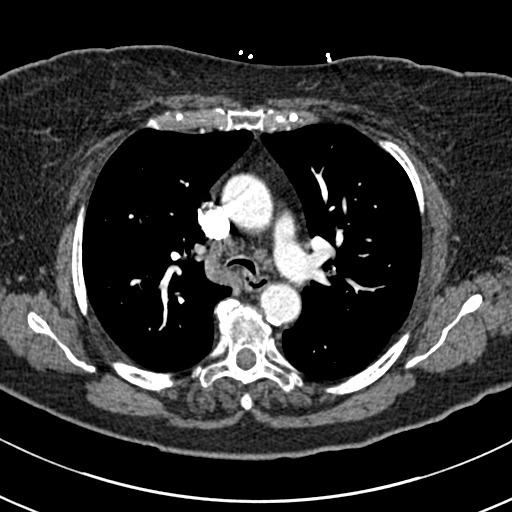
[im 228/292  lung]
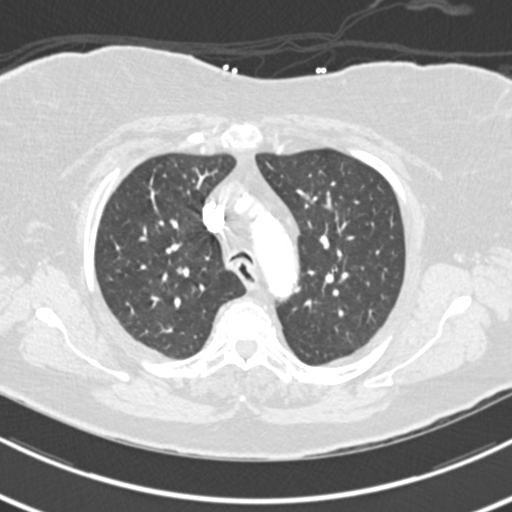
[im 241/292  soft-tissue]
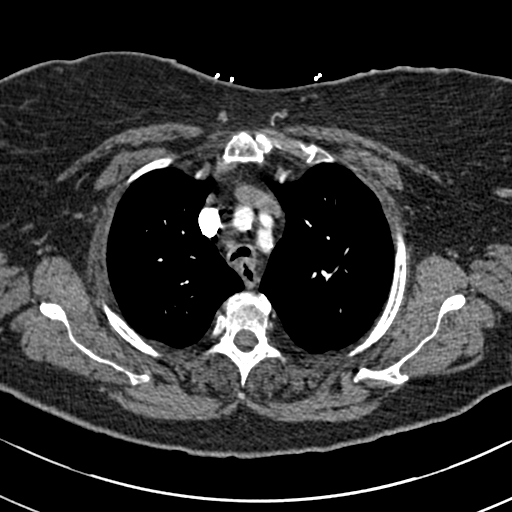
[im 254/292  lung]
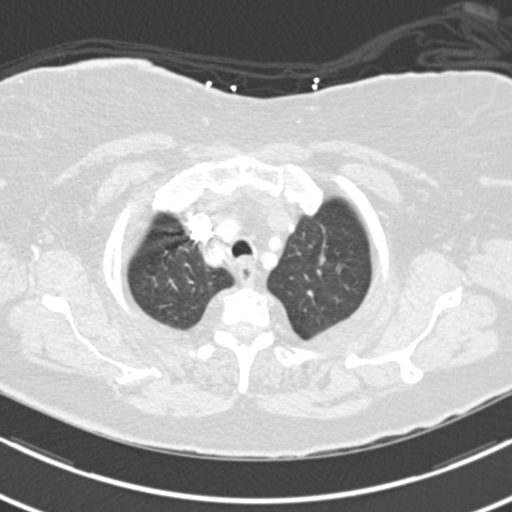
[im 279/292  soft-tissue]
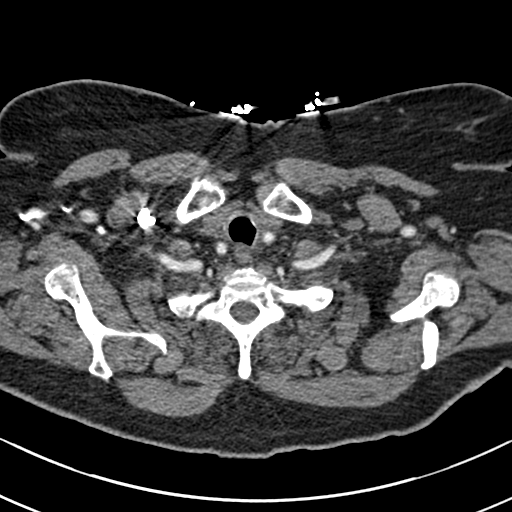

[Series 7: coronal mpr · coronal · 0.57mm/px · 2 of 108 slices shown]
[im 36/108  soft-tissue]
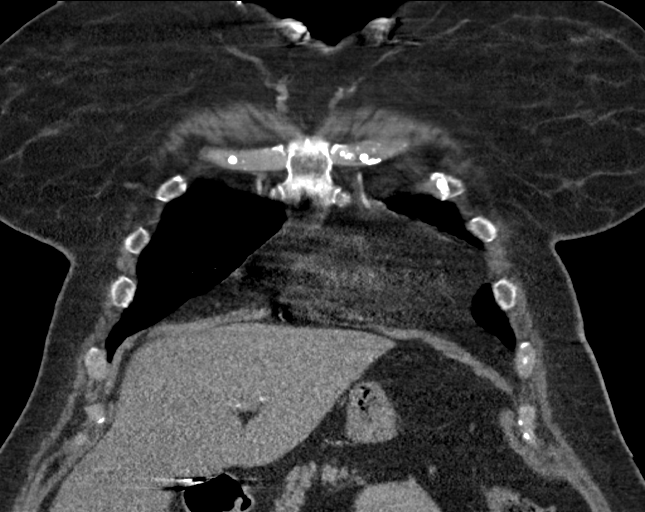
[im 72/108  soft-tissue]
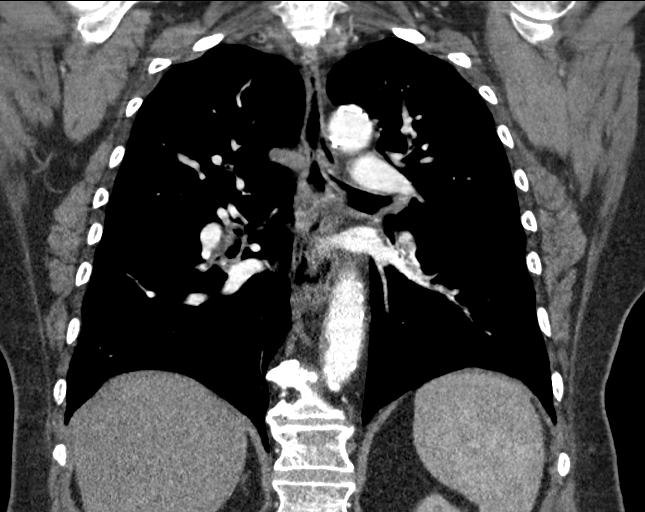

[18 of 46 positions shown; findings below may reference images not displayed]

FINDINGS: Cardiovascular: Satisfactory opacification of the pulmonary arteries
to the segmental level. No evidence of pulmonary embolism.

Heart is normal in size. There are moderate coronary artery
calcifications. Aorta is normal in caliber. No dissection.
Atherosclerotic calcifications are noted along the thoracic aorta
and at the origins of the branch vessels.

Mediastinum/Nodes: No mediastinal or hilar masses or enlarged lymph
nodes. Trachea and esophagus are unremarkable.

Lungs/Pleura: Focal area of ground-glass opacity in the medial right
upper lobe, centered on image 36, series 6, measuring 14 mm. 5 mm,
discoid area of opacity adjacent to the minor fissure in the
superior right middle lobe, likely an area of atelectasis or a
subpleural lymph node. No other lung nodules or areas of
consolidation. There is no pulmonary edema. No pleural effusion. No
pneumothorax.

Upper Abdomen: No acute finding.  Status post cholecystectomy.

Musculoskeletal: No fracture. No osteoblastic or osteolytic lesions.
Bones are demineralized. Degenerative changes noted along the
visualized spine.

Review of the MIP images confirms the above findings.
IMPRESSION: 1. No evidence of a pulmonary embolus.
2. 14 mm focal area of ground-glass opacity, which could represent a
small area of infection. Initial follow-up with CT at 6-12 months is
recommended to confirm persistence. If persistent, repeat CT is
recommended every 2 years until 5 years of stability has been
established. This recommendation follows the consensus statement:
Guidelines for Management of Incidental Pulmonary Nodules Detected
[DATE].
3. No other evidence of an acute abnormality.
4. Coronary artery calcifications and aortic atherosclerosis.

## 2017-11-01 IMAGING — DX DG CHEST 1V PORT
1 series · 1 of 1 positions shown · non-contrast
Comparison: No prior.

CLINICAL DATA: Shortness of breath.

EXAM:
PORTABLE CHEST 1 VIEW

[chest ap]
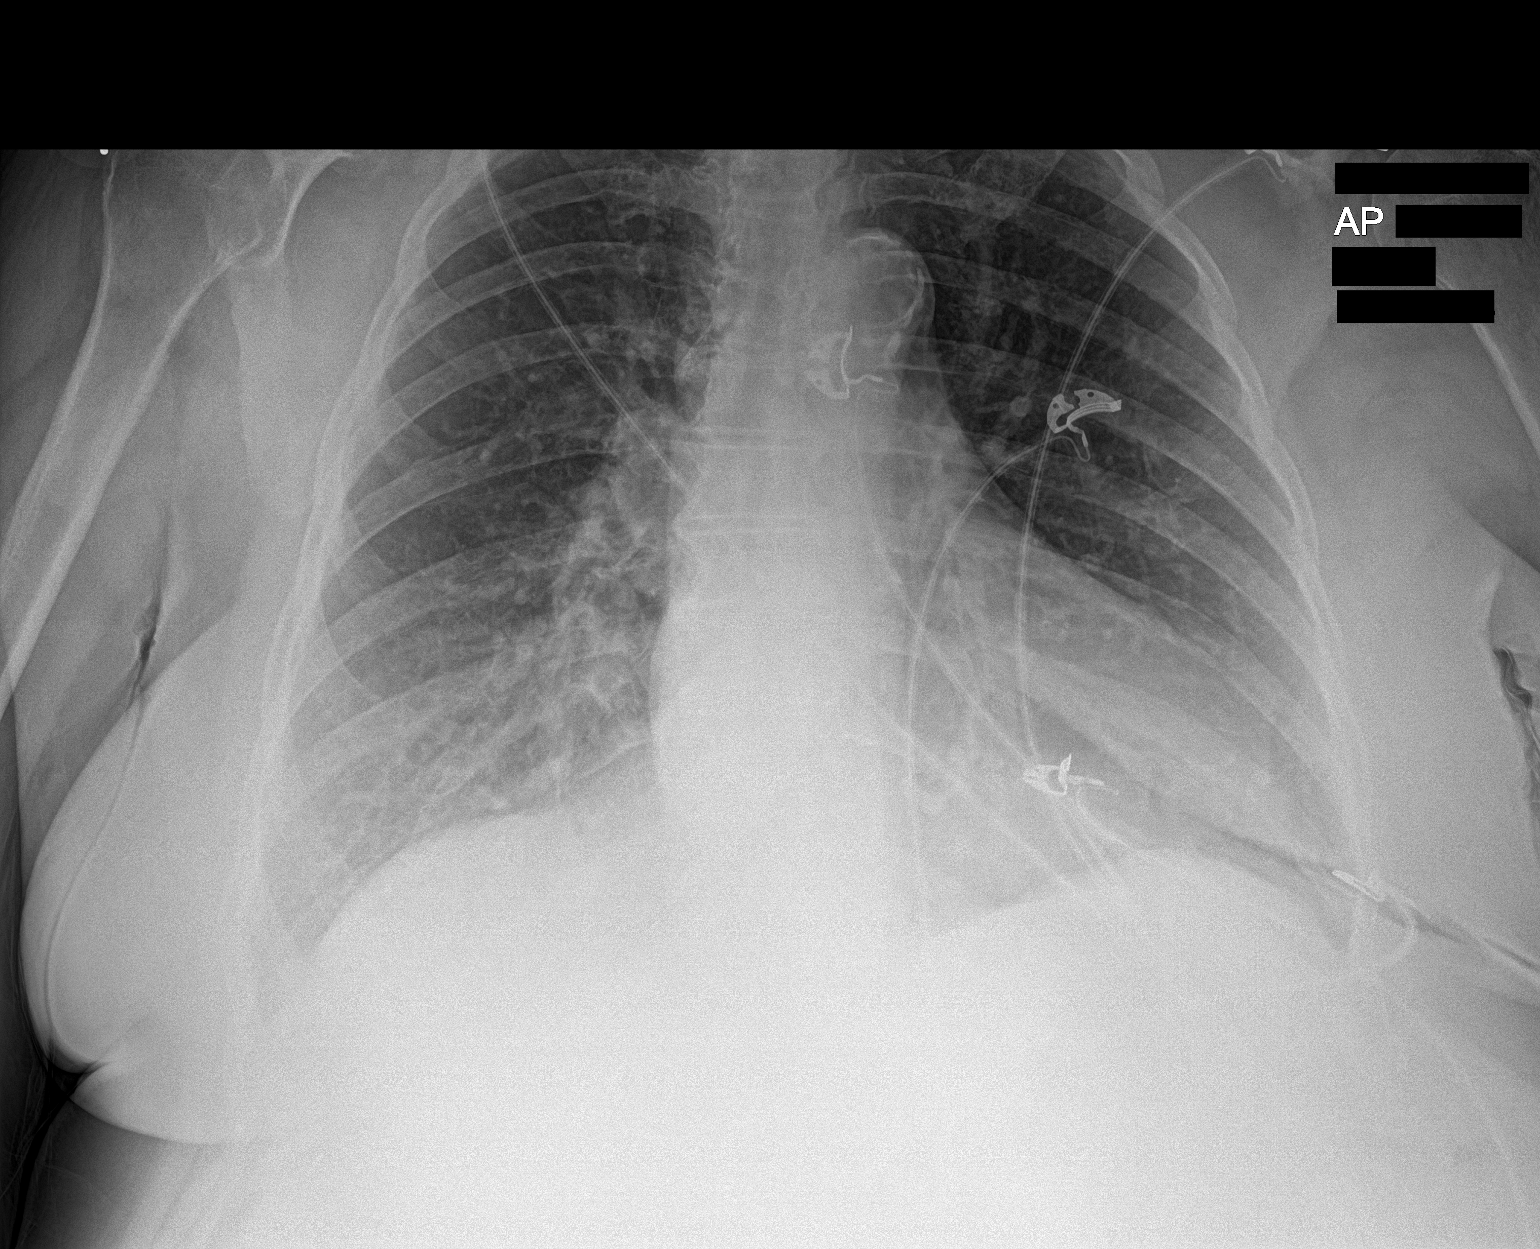

[1 of 1 positions shown; findings below may reference images not displayed]

FINDINGS: Cardiomegaly with normal pulmonary vascularity. Low lung volumes
with basilar atelectasis. Bibasilar infiltrates/edema cannot be
excluded. Small bilateral pleural effusions cannot be excluded .
IMPRESSION: 1. Low lung volumes with bibasilar atelectasis. Bibasilar
infiltrates and or edema cannot be completely excluded. Small
bilateral pleural effusions cannot be excluded.

2. Cardiomegaly.
